# Patient Record
Sex: Female | Born: 1965
Health system: Southern US, Community
[De-identification: ages and names within clinical notes are randomized; demographics above are authoritative.]

## PROBLEM LIST (undated history)

## (undated) ENCOUNTER — Inpatient Hospital Stay (HOSPITAL_COMMUNITY): Payer: Self-pay

## (undated) DIAGNOSIS — F329 Major depressive disorder, single episode, unspecified: Secondary | ICD-10-CM

## (undated) DIAGNOSIS — Z8619 Personal history of other infectious and parasitic diseases: Secondary | ICD-10-CM

## (undated) DIAGNOSIS — O139 Gestational [pregnancy-induced] hypertension without significant proteinuria, unspecified trimester: Secondary | ICD-10-CM

## (undated) DIAGNOSIS — R87619 Unspecified abnormal cytological findings in specimens from cervix uteri: Secondary | ICD-10-CM

## (undated) DIAGNOSIS — B009 Herpesviral infection, unspecified: Secondary | ICD-10-CM

## (undated) DIAGNOSIS — O09529 Supervision of elderly multigravida, unspecified trimester: Secondary | ICD-10-CM

## (undated) DIAGNOSIS — R1110 Vomiting, unspecified: Secondary | ICD-10-CM

## (undated) DIAGNOSIS — IMO0002 Reserved for concepts with insufficient information to code with codable children: Secondary | ICD-10-CM

## (undated) DIAGNOSIS — A630 Anogenital (venereal) warts: Secondary | ICD-10-CM

## (undated) DIAGNOSIS — F419 Anxiety disorder, unspecified: Secondary | ICD-10-CM

## (undated) DIAGNOSIS — F32A Depression, unspecified: Secondary | ICD-10-CM

## (undated) DIAGNOSIS — G43909 Migraine, unspecified, not intractable, without status migrainosus: Secondary | ICD-10-CM

## (undated) HISTORY — PX: GYNECOLOGIC CRYOSURGERY: SHX857

## (undated) HISTORY — DX: Personal history of other infectious and parasitic diseases: Z86.19

## (undated) HISTORY — DX: Anogenital (venereal) warts: A63.0

## (undated) HISTORY — DX: Supervision of elderly multigravida, unspecified trimester: O09.529

## (undated) HISTORY — PX: COLPOSCOPY: SHX161

## (undated) HISTORY — DX: Vomiting, unspecified: R11.10

## (undated) HISTORY — PX: BREAST SURGERY: SHX581

## (undated) HISTORY — DX: Herpesviral infection, unspecified: B00.9

---

## 1999-06-21 HISTORY — PX: BREAST ENHANCEMENT SURGERY: SHX7

## 2001-06-20 HISTORY — PX: WISDOM TOOTH EXTRACTION: SHX21

## 2004-05-27 ENCOUNTER — Inpatient Hospital Stay (HOSPITAL_COMMUNITY): Admission: AD | Admit: 2004-05-27 | Discharge: 2004-05-27 | Payer: Self-pay | Admitting: Obstetrics and Gynecology

## 2004-06-01 ENCOUNTER — Inpatient Hospital Stay (HOSPITAL_COMMUNITY): Admission: AD | Admit: 2004-06-01 | Discharge: 2004-06-04 | Payer: Self-pay | Admitting: Obstetrics and Gynecology

## 2004-06-14 ENCOUNTER — Observation Stay (HOSPITAL_COMMUNITY): Admission: AD | Admit: 2004-06-14 | Discharge: 2004-06-15 | Payer: Self-pay | Admitting: Obstetrics and Gynecology

## 2004-06-23 ENCOUNTER — Observation Stay (HOSPITAL_COMMUNITY): Admission: AD | Admit: 2004-06-23 | Discharge: 2004-06-23 | Payer: Self-pay | Admitting: Obstetrics and Gynecology

## 2004-09-15 ENCOUNTER — Inpatient Hospital Stay (HOSPITAL_COMMUNITY): Admission: AD | Admit: 2004-09-15 | Discharge: 2004-09-15 | Payer: Self-pay | Admitting: Obstetrics and Gynecology

## 2004-11-09 ENCOUNTER — Observation Stay (HOSPITAL_COMMUNITY): Admission: AD | Admit: 2004-11-09 | Discharge: 2004-11-10 | Payer: Self-pay | Admitting: Obstetrics and Gynecology

## 2004-12-08 ENCOUNTER — Inpatient Hospital Stay (HOSPITAL_COMMUNITY): Admission: AD | Admit: 2004-12-08 | Discharge: 2004-12-10 | Payer: Self-pay | Admitting: Obstetrics and Gynecology

## 2005-03-02 ENCOUNTER — Other Ambulatory Visit: Admission: RE | Admit: 2005-03-02 | Discharge: 2005-03-02 | Payer: Self-pay | Admitting: Obstetrics and Gynecology

## 2005-09-23 ENCOUNTER — Emergency Department (HOSPITAL_COMMUNITY): Admission: EM | Admit: 2005-09-23 | Discharge: 2005-09-23 | Payer: Self-pay | Admitting: Emergency Medicine

## 2008-01-27 ENCOUNTER — Emergency Department (HOSPITAL_BASED_OUTPATIENT_CLINIC_OR_DEPARTMENT_OTHER): Admission: EM | Admit: 2008-01-27 | Discharge: 2008-01-27 | Payer: Self-pay | Admitting: Emergency Medicine

## 2009-03-27 ENCOUNTER — Ambulatory Visit: Payer: Self-pay | Admitting: Interventional Radiology

## 2009-03-27 ENCOUNTER — Emergency Department (HOSPITAL_BASED_OUTPATIENT_CLINIC_OR_DEPARTMENT_OTHER): Admission: EM | Admit: 2009-03-27 | Discharge: 2009-03-27 | Payer: Self-pay | Admitting: Emergency Medicine

## 2010-06-20 HISTORY — PX: CERVICAL BIOPSY: SHX590

## 2010-07-19 ENCOUNTER — Emergency Department (HOSPITAL_BASED_OUTPATIENT_CLINIC_OR_DEPARTMENT_OTHER)
Admission: EM | Admit: 2010-07-19 | Discharge: 2010-07-19 | Payer: Self-pay | Source: Home / Self Care | Admitting: Emergency Medicine

## 2010-11-05 NOTE — Discharge Summary (Signed)
NAME:  Linda Fitzpatrick, Linda Fitzpatrick      ACCOUNT NO.:  [id number]   MEDICAL RECORD NO.:  [id number]          PATIENT TYPE:  INP   LOCATION:  9131                          FACILITY:  [facility]   PHYSICIAN:  Dineen Kid. Rana Snare, M.D.    DATE OF BIRTH:  1966-03-15   DATE OF ADMISSION:  06/01/2004  DATE OF DISCHARGE:  06/04/2004                                 DISCHARGE SUMMARY   ADMITTING DIAGNOSES:  1.  Intrauterine pregnancy at 10 weeks estimated gestational age.  2.  Hyperemesis gravidarum.   </DISCHARGE DIAGNOSIS  Intrauterine pregnancy at 10+ weeks estimated gestational age with  hyperemesis gravidarum, resolved.   REASON FOR ADMISSION:  Please see dictated H&P.   HOSPITAL COURSE:  The patient was a 45 year old gravida 2, para 0 that was  admitted to [facility] with severe hyperemesis which had  been unresponsive to outpatient management. The patient had been previously  hospitalized and had been discharged home on oral Medrol protocol, but the  patient was unable to keep down any food or fluid and continues to  experience severe nausea and vomiting. Recent thyroid panel had been within  normal limits. The patient was readmitted at this time for hydration and  further management. On admission, vital signs were stable, she was afebrile.  Uterus was nontender. The patient was given IV fluids and continued on  Medrol dose pack and Zofran IV, Reglan, and Pepcid as needed. On the  following morning the patient was much improved. No emesis had occurred  since admission. Vital signs remained stable. She was afebrile. Abdomen  soft. The patient was changed to oral medications and plan was that if she  could tolerate p.m. medications she would be discharged on the following  morning. On the following morning the patient continued without vomiting on  p.o. medications. Vital signs were stable, she was afebrile, abdomen soft.  She was tolerating a regular diet. Discharge instructions were  given and she  was discharged home.   CONDITION ON DISCHARGE:  Stable.   DIET:  Regular as tolerated.   ACTIVITY:  Up as desired.   FOLLOW UP:  Patient is to follow up in the office in approximately 10 days.  She is to call for nausea, vomiting, vaginal bleeding or uterine cramping.   DISCHARGE MEDICATIONS:  1.  Reglan 10 mg p.o. prior to meals and at bedtime.  2.  Pepcid 20 mg one p.o. b.i.d.  3.  The patient to continue Medrol Dosepak.      CC/MEDQ  D:  09/13/2004  T:  09/13/2004  Job:  [id number]

## 2010-11-05 NOTE — H&P (Signed)
Linda Fitzpatrick, RIES      ACCOUNT NO.:  [id number]   MEDICAL RECORD NO.:  [id number]           PATIENT TYPE:   LOCATION:                                 FACILITY:   PHYSICIAN:  Duke Salvia. Marcelle Overlie, M.D.    DATE OF BIRTH:   DATE OF ADMISSION:  06/01/2004  DATE OF DISCHARGE:                                HISTORY & PHYSICAL   CHIEF COMPLAINT:  Hyperemesis.   HISTORY OF PRESENT ILLNESS:  This 45 year old G2, P0-0-1-0, EDD of July 1,  has had severe hyperemesis that has been unresponsive to outpatient  management.  She has been hospitalized previously at [facility] and was discharged  home on the oral Medrol protocol, but she cannot keep this down and  continues to experience severe nausea and vomiting.  Recent thyroid profile  was normal.  She is readmitted at this time for hydration and further  evaluation.   PAST MEDICAL HISTORY:   ALLERGIES:  None.   OBSTETRICAL HISTORY:  One TAB in 1993.  She also had cryo in 1993.   PAST SURGICAL HISTORY:  She has had breast augmentation.   FAMILY HISTORY:  Significant for mother with hypertension.  The patient, her  father, and sister have all had problems with migraine headaches.   PHYSICAL EXAMINATION:  VITAL SIGNS:  Temperature 98.2, blood pressure  100/60.  HEENT:  Unremarkable.  NECK:  Supple without masses.  LUNGS:  Clear.  CARDIOVASCULAR:  Regular rate and rhythm without murmurs, rubs, or gallops.  BREASTS:  Not examined.  ABDOMEN:  Soft, flat, nontender.  PELVIC:  Exam deferred pending ultrasound in the a.m.  EXTREMITIES/NEUROLOGIC:  Unremarkable.   IMPRESSION:  1.  A 10+ week intrauterine pregnancy.  2.  Severe hyperemesis.   PLAN:  Will admit for Zofran, Reglan, and Pepcid protocol IV, plus IV  hydration.    Rich  RMH/MEDQ  D:  06/01/2004  T:  06/01/2004  Job:  [id number]

## 2011-06-21 NOTE — L&D Delivery Note (Signed)
Delivery Note  SVD viable female Apgars 7,8 over 2nd degree ML lac.  Placenta delivered spontaneously intact with 3VC. Repair with 2-0 Chromic with good support and hemostasis noted and R/V exam confirms.  PH art was snet.  [place] cord blood was done.  Mother and baby were doing well.  EBL 300cc  Candice Camp, MD

## 2011-06-24 ENCOUNTER — Inpatient Hospital Stay (HOSPITAL_COMMUNITY)
Admission: AD | Admit: 2011-06-24 | Discharge: 2011-06-24 | Disposition: A | Discharge status: Home / Self Care | Payer: Managed Care, Other (non HMO) | Source: Ambulatory Visit | Attending: Obstetrics and Gynecology | Admitting: Obstetrics and Gynecology

## 2011-06-24 ENCOUNTER — Encounter (HOSPITAL_COMMUNITY): Payer: Self-pay

## 2011-06-24 DIAGNOSIS — O210 Mild hyperemesis gravidarum: Secondary | ICD-10-CM | POA: Insufficient documentation

## 2011-06-24 DIAGNOSIS — O26899 Other specified pregnancy related conditions, unspecified trimester: Secondary | ICD-10-CM

## 2011-06-24 DIAGNOSIS — O219 Vomiting of pregnancy, unspecified: Secondary | ICD-10-CM

## 2011-06-24 DIAGNOSIS — R51 Headache: Secondary | ICD-10-CM

## 2011-06-24 HISTORY — DX: Reserved for concepts with insufficient information to code with codable children: IMO0002

## 2011-06-24 HISTORY — DX: Depression, unspecified: F32.A

## 2011-06-24 HISTORY — DX: Unspecified abnormal cytological findings in specimens from cervix uteri: R87.619

## 2011-06-24 HISTORY — DX: Anxiety disorder, unspecified: F41.9

## 2011-06-24 HISTORY — DX: Major depressive disorder, single episode, unspecified: F32.9

## 2011-06-24 HISTORY — DX: Gestational (pregnancy-induced) hypertension without significant proteinuria, unspecified trimester: O13.9

## 2011-06-24 LAB — URINE MICROSCOPIC-ADD ON

## 2011-06-24 LAB — URINALYSIS, ROUTINE W REFLEX MICROSCOPIC
Bilirubin Urine: NEGATIVE
Hgb urine dipstick: NEGATIVE
Ketones, ur: NEGATIVE mg/dL
Nitrite: NEGATIVE
Protein, ur: NEGATIVE mg/dL
Specific Gravity, Urine: 1.01 (ref 1.005–1.030)
Urobilinogen, UA: 0.2 mg/dL (ref 0.0–1.0)

## 2011-06-24 LAB — OB RESULTS CONSOLE HEPATITIS B SURFACE ANTIGEN: Hepatitis B Surface Ag: NEGATIVE

## 2011-06-24 LAB — OB RESULTS CONSOLE GC/CHLAMYDIA: Chlamydia: NEGATIVE

## 2011-06-24 LAB — OB RESULTS CONSOLE RPR: RPR: NONREACTIVE

## 2011-06-24 LAB — OB RESULTS CONSOLE ANTIBODY SCREEN: Antibody Screen: NEGATIVE

## 2011-06-24 LAB — OB RESULTS CONSOLE RUBELLA ANTIBODY, IGM: Rubella: IMMUNE

## 2011-06-24 MED ORDER — HYDROCODONE-ACETAMINOPHEN 5-325 MG PO TABS: 1.0000 | ORAL_TABLET | Freq: Three times a day (TID) | ORAL | Status: AC | PRN

## 2011-06-24 MED ORDER — PROMETHAZINE HCL 25 MG/ML IJ SOLN
12.5000 mg | Freq: Once | INTRAVENOUS | Status: AC
  Administered 2011-06-24: 12.5 mg via INTRAVENOUS
  Filled 2011-06-24: qty 0.5

## 2011-06-24 MED ORDER — BUTORPHANOL TARTRATE 2 MG/ML IJ SOLN
1.0000 mg | Freq: Once | INTRAMUSCULAR | Status: AC
  Administered 2011-06-24: 1 mg via INTRAVENOUS
  Filled 2011-06-24: qty 1

## 2011-06-24 MED ORDER — LACTATED RINGERS IV SOLN
INTRAVENOUS | Status: DC
  Administered 2011-06-24: 16:00:00 via INTRAVENOUS

## 2011-06-24 NOTE — Progress Notes (Signed)
Vomiting was taking Zofran ODT not helping

## 2011-06-24 NOTE — ED Provider Notes (Signed)
History     Chief Complaint  Patient presents with  . Hyperemesis Gravidarum   HPI Linda Fitzpatrick is 46 y.o. [phone] [redacted]w[redacted]d weeks presenting with hyperemesis.  She is a patient of Dr. Vance Gather.  She was in the office today for initial prenatal workup and reported sxs to the Rn.  Sent here for hydration.  Had same with previous pregnancy for the entire pregnancy.  Prefers not to use Zofran because it gives her a headache.      Past Medical History  Diagnosis Date  . Anxiety   . Depression   . Abnormal Pap smear   . Pregnancy induced hypertension     Past Surgical History  Procedure Date  . Cervical biopsy 2012    abnormal pap  . Breast enhancement surgery 2001  . Wisdom tooth extraction 2003    Family History  Problem Relation Age of Onset  . Anesthesia problems Neg Hx     History  Substance Use Topics  . Smoking status: Never Smoker   . Smokeless tobacco: Never Used  . Alcohol Use: No    Allergies:  Allergies  Allergen Reactions  . Tramadol Nausea Only and Other (See Comments)    dizzieness and leg cramps    Prescriptions prior to admission  Medication Sig Dispense Refill  . buPROPion (WELLBUTRIN XL) 300 MG 24 hr tablet Take 300 mg by mouth daily.        . calcium carbonate (TUMS - DOSED IN MG ELEMENTAL CALCIUM) 500 MG chewable tablet Chew 1 tablet by mouth 3 (three) times daily as needed. For stomach upset.       Marland Kitchen FLUoxetine (PROZAC) 20 MG capsule Take 20 mg by mouth daily.        . ondansetron (ZOFRAN-ODT) 8 MG disintegrating tablet Take 8 mg by mouth every 6 (six) hours as needed. For nausea       . Pediatric Multivit-Minerals-C (FLINTSTONES GUMMIES PO) Take 2 tablets by mouth daily.        . promethazine (PHENERGAN) 25 MG suppository Place 25 mg rectally daily as needed. For nausea         Review of Systems  Gastrointestinal: Positive for nausea and vomiting.  Neurological: Positive for headaches.   Physical Exam   Blood pressure 117/63, pulse 84,  temperature 98.3 F (36.8 C), temperature source Oral, resp. rate 16, height 5\' 2"  (1.575 m), weight 118 lb (53.524 kg), last menstrual period 04/17/2011.  Physical Exam  Constitutional: She is oriented to person, place, and time. She appears well-developed and well-nourished.  HENT:  Head: Normocephalic.  Neck: Normal range of motion.  Neurological: She is alert and oriented to person, place, and time.  Skin: Skin is warm and dry.  Psychiatric: She has a normal mood and affect.    MAU Course  Procedures  Stadol 1mg  IV ordered.  Patient has contraindication by pharmacy alert for nausea with tramadol.  IV hydration completed  MDM Called Dr. Rana Snare to discuss patient. Will hydrate with 1 liter of fluid with Phenergan.   15:40 reported to Dr. Rana Snare that her nausea and vomiting has improved.  Headache continues and is asking for medication.  Order given for Stadol 1mg  IV for pain now and give Rx for Vicoden for home 17:35.  Patient is feeling better now.  Her nausea and vomiting is still controlled.  She has been able to eat crackers.  Her headache is also better after given Stadol 1mg .  She is ready for discharge.  She has a ride.  Assessment and Plan  A: Nausea and vomiting in early pregnancy     Headache  P:  Per Dr. Rana Snare, she has Rxs for phenergan and zofran at home.        Rx for Vicodin \      Instructed to follow up with Dr. Jacky Kindle M 06/24/2011, 12:58 PM   Matt Holmes, NP 06/24/11 1743

## 2011-09-21 ENCOUNTER — Inpatient Hospital Stay (HOSPITAL_COMMUNITY)
Admission: AD | Admit: 2011-09-21 | Discharge: 2011-09-21 | Disposition: A | Discharge status: Home / Self Care | Payer: Managed Care, Other (non HMO) | Source: Ambulatory Visit | Attending: Obstetrics and Gynecology | Admitting: Obstetrics and Gynecology

## 2011-09-21 ENCOUNTER — Encounter (HOSPITAL_COMMUNITY): Payer: Self-pay | Admitting: *Deleted

## 2011-09-21 DIAGNOSIS — O210 Mild hyperemesis gravidarum: Secondary | ICD-10-CM | POA: Insufficient documentation

## 2011-09-21 LAB — URINALYSIS, ROUTINE W REFLEX MICROSCOPIC
Bilirubin Urine: NEGATIVE
Glucose, UA: NEGATIVE mg/dL
Nitrite: NEGATIVE
Protein, ur: NEGATIVE mg/dL
Specific Gravity, Urine: >1.03 — ABNORMAL HIGH (ref 1.005–1.030)
Urobilinogen, UA: 0.2 mg/dL (ref 0.0–1.0)

## 2011-09-21 LAB — URINE MICROSCOPIC-ADD ON

## 2011-09-21 MED ORDER — ONDANSETRON HCL 4 MG PO TABS: 8.0000 mg | ORAL_TABLET | Freq: Once | ORAL | Status: DC

## 2011-09-21 MED ORDER — DEXTROSE 5 % IN LACTATED RINGERS IV BOLUS
500.0000 mL | Freq: Once | INTRAVENOUS | Status: AC
Start: 2011-09-21 — End: 2011-09-21
  Administered 2011-09-21: 500 mL via INTRAVENOUS

## 2011-09-21 NOTE — MAU Provider Note (Signed)
History     CSN: [id number]  Arrival date and time: 09/21/11 1744   First Provider Initiated Contact with Patient 09/21/11 1830      Chief Complaint  Patient presents with  . Hematemesis   HPI Linda Fitzpatrick is 46 y.o. [phone] [redacted]w[redacted]d weeks presenting with continued nausea, vomiting.  "Nothing is working".  She was getting IV hydration 1 liter a day at home until last week, was discontinued because she wasn't spilling ketones.  " I am at my breaking point with the leg cramps, insomnia, I am in bed 24 hrs a day.  I have to stay flat and quiet".  Saw Dr. Marcelle Overlie yesterday, told to call if sxs worsened.  Called the office.  No one treatment completely worked--pumped worked some days and not others.  Got knots under the skin with zofran and reglan injections caused knots.  Same symptoms with first pregnancy.      Past Medical History  Diagnosis Date  . Anxiety   . Depression   . Abnormal Pap smear   . Pregnancy induced hypertension     Past Surgical History  Procedure Date  . Cervical biopsy 2012    abnormal pap  . Breast enhancement surgery 2001  . Wisdom tooth extraction 2003    Family History  Problem Relation Age of Onset  . Anesthesia problems Neg Hx     History  Substance Use Topics  . Smoking status: Never Smoker   . Smokeless tobacco: Never Used  . Alcohol Use: No    Allergies:  Allergies  Allergen Reactions  . Tramadol Nausea Only and Other (See Comments)    dizzieness and leg cramps    Prescriptions prior to admission  Medication Sig Dispense Refill  . acetaminophen (TYLENOL) 325 MG tablet Take 650 mg by mouth every 6 (six) hours as needed. Head ache      . buPROPion (WELLBUTRIN XL) 300 MG 24 hr tablet Take 300 mg by mouth daily.        . calcium carbonate (TUMS - DOSED IN MG ELEMENTAL CALCIUM) 500 MG chewable tablet Chew 1 tablet by mouth 3 (three) times daily as needed. For stomach upset.       Marland Kitchen FLUoxetine (PROZAC) 20 MG capsule Take 20 mg by mouth  daily.        Marland Kitchen nystatin cream (MYCOSTATIN) Apply topically 2 (two) times daily.      . ondansetron (ZOFRAN-ODT) 8 MG disintegrating tablet Take 8 mg by mouth every 6 (six) hours as needed. For nausea       . Pediatric Multivit-Minerals-C (FLINTSTONES GUMMIES PO) Take 2 tablets by mouth daily.        . promethazine (PHENERGAN) 25 MG suppository Place 25 mg rectally daily as needed. For nausea       . terconazole (TERAZOL 7) 0.4 % vaginal cream Place 1 applicator vaginally at bedtime.        Review of Systems  Gastrointestinal: Positive for nausea.  Musculoskeletal:       + leg cramps   Physical Exam   Blood pressure 131/76, pulse 85, temperature 98 F (36.7 C), temperature source Oral, resp. rate 16, height 5' 3.5" (1.613 m), weight 59.966 kg (132 lb 3.2 oz), last menstrual period 04/17/2011, SpO2 100.00%.  Physical Exam  Constitutional: She is oriented to person, place, and time. She appears well-nourished. No distress.  HENT:  Head: Normocephalic.  Neck: Normal range of motion.  Respiratory: Effort normal.  Neurological: She is alert and  oriented to person, place, and time.  Skin: Skin is warm and dry.  Psychiatric: She has a normal mood and affect. Her behavior is normal.      Results for orders placed during the hospital encounter of 09/21/11 (from the past 24 hour(s))  URINALYSIS, ROUTINE W REFLEX MICROSCOPIC     Status: Abnormal   Collection Time   09/21/11  6:10 PM      Component Value Range   Color, Urine YELLOW  YELLOW    APPearance CLOUDY (*) CLEAR    Specific Gravity, Urine >1.030 (*) 1.005 - 1.030    pH 6.0  5.0 - 8.0    Glucose, UA NEGATIVE  NEGATIVE (mg/dL)   Hgb urine dipstick TRACE (*) NEGATIVE    Bilirubin Urine NEGATIVE  NEGATIVE    Ketones, ur NEGATIVE  NEGATIVE (mg/dL)   Protein, ur NEGATIVE  NEGATIVE (mg/dL)   Urobilinogen, UA 0.2  0.0 - 1.0 (mg/dL)   Nitrite NEGATIVE  NEGATIVE    Leukocytes, UA MODERATE (*) NEGATIVE   URINE MICROSCOPIC-ADD ON      Status: Abnormal   Collection Time   09/21/11  6:10 PM      Component Value Range   Squamous Epithelial / LPF MANY (*) RARE    WBC, UA 7-10  <3 (WBC/hpf)   Bacteria, UA MANY (*) RARE    Crystals CA OXALATE CRYSTALS (*) NEGATIVE    Urine-Other FEW YEAST      MAU Course  Procedures  MDM19:00 Reported patient sxs to Dr. Arelia Sneddon, he is aware of patient's hx.  Order given for 1 liter of D5LR, if not driving may add Phenergan 25mg .  Patient informed and she is trying to get a ride home.  She prefers to have the Phenergan Patient rang out and stated she does not have a ride home.  Will give her Zofran 8mg  ODT--patient declined Zofran. 20:28  Patient is ready to leave when liter of fluid is out.  She does not want Rx for Zofran.  Assessment and Plan  A: Hyperemesis--persistent into 2nd trimester  P:  Dr. Arelia Sneddon gave instructions for her to call the office and to come in to discuss treatment plan with Dr. Rana Snare on Monday.      Patient wants to call tomorrow.     Has medication at home.  Joshua Zeringue,EVE M 09/21/2011, 6:34 PM

## 2011-09-21 NOTE — MAU Note (Signed)
Patient states she has hyperemesis. Has had a reglan and zofran pump that does not work. Took out her reglan pump last night. States she is unable to function or sleep due to the nausea and vomiting. Nausea is worse than the vomiting. Has been getting home health IV hydration. No pain at this time.

## 2011-09-21 NOTE — MAU Note (Signed)
Patient states she does not want the Zofran since it does not help but would like the IV fluids.

## 2011-09-21 NOTE — Discharge Instructions (Signed)
Hyperemesis Gravidarum Diet  Hyperemesis gravidarum is a severe form of morning sickness. It is characterized by frequent and severe vomiting. It happens during the first trimester of pregnancy. It may be caused by the rapid hormone changes that happen during pregnancy. It is associated with a 5% weight loss of pre-pregnancy weight. The hyperemesis diet may be used to lessen symptoms of nausea and vomiting.  EATING GUIDELINES  · Eat 5 to 6 small meals daily instead of 3 large meals.  · Avoid foods with strong smells.  · Avoid drinking 30 minutes before and after meals.  · Avoid fried or high-fat foods, such as butter and cream sauces.  · Starchy foods are usually well-tolerated, such as cereal, toast, bread, potatoes, pasta, rice, and pretzels.  · Eat crackers before you get out of bed in the morning.  · Avoid spicy foods.  · Ginger may help with nausea. Add ¼ tsp ginger to hot tea or choose ginger tea.  · Continue to take your prenatal vitamins as directed by your caregiver.  SAMPLE MEAL PLAN  Breakfast   · ½ cup oatmeal  · 1 slice toast  · 1 tsp heart-healthy margarine  · 1 tsp jelly  · 1 scrambled egg  Midmorning Snack   · 1 cup low-fat yogurt  Lunch   · Plain ham sandwich  · Carrot or celery sticks  · 1 small apple  · 3 graham crackers  Midafternoon Snack   · Cheese and crackers  Dinner  · 4 oz pork tenderloin  · 1 small baked potato  · 1 tsp margarine  · ½ cup broccoli  · ½ cup grapes  Evening Snack  · 1 cup pudding  Document Released: 04/03/2007 Document Revised: 05/26/2011 Document Reviewed: 10/01/2010  ExitCare® Patient Information ©2012 [employer].  Hyperemesis Gravidarum  Hyperemesis gravidarum is a severe form of nausea and vomiting that happens during pregnancy. Hyperemesis is worse than morning sickness. It may cause a woman to have nausea or vomiting all day for many days. It may keep a woman from eating and drinking enough food and liquids. Hyperemesis usually occurs during the first half (the  first 20 weeks) of pregnancy. It often goes away once a woman is in her second half of pregnancy. However, sometimes hyperemesis continues through an entire pregnancy.   CAUSES   The cause of this condition is not completely known but is thought to be due to changes in the body's hormones when pregnant. It could be the high level of the pregnancy hormone or an increase in estrogen in the body.   SYMPTOMS   · Severe nausea and vomiting.  · Nausea that does not go away.  · Vomiting that does not allow you to keep any food down.  · Weight loss and body fluid loss (dehydration).  · Having no desire to eat or not liking food you have previously enjoyed.  DIAGNOSIS   Your caregiver may ask you about your symptoms. Your caregiver may also order blood tests and urine tests to make sure something else is not causing the problem.   TREATMENT   You may only need medicine to control the problem. If medicines do not control the nausea and vomiting, you will be treated in the hospital to prevent dehydration, acidosis, weight loss, and changes in the electrolytes in your body that may harm the unborn baby (fetus). You may need intravenous (IV) fluids.   HOME CARE INSTRUCTIONS   · Take all medicine as directed   by your caregiver.  · Try eating a couple of dry crackers or toast in the morning before getting out of bed.  · Avoid foods and smells that upset your stomach.  · Avoid fatty and spicy foods. Eat 5 to 6 small meals a day.  · Do not drink when eating meals. Drink between meals.  · For snacks, eat high protein foods, such as cheese. Eat or suck on things that have ginger in them. Ginger helps nausea.  · Avoid food preparation. The smell of food can spoil your appetite.  · Avoid iron pills and iron in your multivitamins until after 3 to 4 months of being pregnant.  SEEK MEDICAL CARE IF:   · Your abdominal pain increases since the last time you saw your caregiver.  · You have a severe headache.  · You develop vision  problems.  · You feel you are losing weight.  SEEK IMMEDIATE MEDICAL CARE IF:   · You are unable to keep fluids down.  · You vomit blood.  · You have constant nausea and vomiting.  · You have a fever.  · You have excessive weakness, dizziness, fainting, or extreme thirst.  MAKE SURE YOU:   · Understand these instructions.  · Will watch your condition.  · Will get help right away if you are not doing well or get worse.  Document Released: 06/06/2005 Document Revised: 05/26/2011 Document Reviewed: 09/06/2010  ExitCare® Patient Information ©2012 [employer].

## 2011-09-21 NOTE — MAU Note (Addendum)
Patient very discouraged. Continues to be very nauseated. States, "I think I am at the breaking point - 22 weeks and no relief. Nothing is working." Pt. states she does not vomit very often and does not usually have ketones. States also that she has gained weight and this is why her pump was discontinued.

## 2011-10-20 ENCOUNTER — Inpatient Hospital Stay (HOSPITAL_COMMUNITY)
Admission: AD | Admit: 2011-10-20 | Discharge: 2011-10-20 | Disposition: A | Discharge status: Home / Self Care | Payer: Managed Care, Other (non HMO) | Source: Ambulatory Visit | Attending: Obstetrics and Gynecology | Admitting: Obstetrics and Gynecology

## 2011-10-20 DIAGNOSIS — O210 Mild hyperemesis gravidarum: Secondary | ICD-10-CM

## 2011-10-20 DIAGNOSIS — O212 Late vomiting of pregnancy: Secondary | ICD-10-CM | POA: Insufficient documentation

## 2011-10-20 LAB — URINALYSIS, ROUTINE W REFLEX MICROSCOPIC
Bilirubin Urine: NEGATIVE
Glucose, UA: NEGATIVE mg/dL
Ketones, ur: NEGATIVE mg/dL
Nitrite: NEGATIVE
Specific Gravity, Urine: 1.01 (ref 1.005–1.030)
Urobilinogen, UA: 0.2 mg/dL (ref 0.0–1.0)
pH: 6.5 (ref 5.0–8.0)

## 2011-10-20 LAB — URINE MICROSCOPIC-ADD ON

## 2011-10-20 MED ORDER — LACTATED RINGERS IV BOLUS (SEPSIS)
1000.0000 mL | Freq: Once | INTRAVENOUS | Status: AC
  Administered 2011-10-20: 1000 mL via INTRAVENOUS

## 2011-10-20 MED ORDER — LACTATED RINGERS IV SOLN
INTRAVENOUS | Status: DC
  Administered 2011-10-20: 20:00:00 via INTRAVENOUS

## 2011-10-20 NOTE — MAU Note (Signed)
Wynona Canes, CNM at bedside.  Assessment done and poc discussed with pt. Ok to dc efm per CNM.

## 2011-10-20 NOTE — MAU Provider Note (Signed)
Chief Complaint:  Hyperemesis Gravidarum   None     HPI  Linda Fitzpatrick is  46 y.o. [phone] at [redacted]w[redacted]d presents with hyperemesis.  She reports talking with Dr Vincente Poli yesterday who wanted her to come in to the MAU for IV fluids and possibly vitamins.  Pt reports forcing herself to drink some water before coming in to MAU and was able to keep some of them down.  She reports good fetal movement, denies LOF, vaginal bleeding, vaginal itching/burning, urinary symptoms, h/a, dizziness, n/v, or fever/chills.    Pregnancy Course: uncomplicated  Past Medical History: Past Medical History  Diagnosis Date  . Anxiety   . Depression   . Abnormal Pap smear   . Pregnancy induced hypertension     Past Surgical History: Past Surgical History  Procedure Date  . Cervical biopsy 2012    abnormal pap  . Breast enhancement surgery 2001  . Wisdom tooth extraction 2003    Family History: Family History  Problem Relation Age of Onset  . Anesthesia problems Neg Hx     Social History: History  Substance Use Topics  . Smoking status: Never Smoker   . Smokeless tobacco: Never Used  . Alcohol Use: No    Allergies:  Allergies  Allergen Reactions  . Tramadol Nausea Only and Other (See Comments)    dizzieness and leg cramps    Meds:  Prescriptions prior to admission  Medication Sig Dispense Refill  . acetaminophen (TYLENOL) 325 MG tablet Take 650 mg by mouth every 6 (six) hours as needed. Head ache      . nystatin cream (MYCOSTATIN) Apply topically 2 (two) times daily.      . Pediatric Multivit-Minerals-C (FLINTSTONES GUMMIES PO) Take 2 tablets by mouth daily.        . promethazine (PHENERGAN) 25 MG suppository Place 25 mg rectally daily as needed. For nausea       . terconazole (TERAZOL 7) 0.4 % vaginal cream Place 1 applicator vaginally at bedtime.          Physical Exam  Blood pressure 104/60, pulse 87, temperature 98.6 F (37 C), temperature source Oral, resp. rate 20, height 5\' 2"   (1.575 m), weight 60.782 kg (134 lb), last menstrual period 04/17/2011. GENERAL: Well-developed, well-nourished female in no acute distress.  HEENT: normocephalic, good dentition HEART: normal rate RESP: normal effort ABDOMEN: Soft, nontender, nondistended, gravid.  EXTREMITIES: Nontender, no edema NEURO: alert and oriented  SPECULUM EXAM: Deferred    FHT:  Baseline 135 , moderate variability, accelerations present, no decelerations Contractions: None   Labs: Results for orders placed during the hospital encounter of 10/20/11 (from the past 24 hour(s))  URINALYSIS, ROUTINE W REFLEX MICROSCOPIC     Status: Abnormal   Collection Time   10/20/11  5:35 PM      Component Value Range   Color, Urine YELLOW  YELLOW    APPearance CLOUDY (*) CLEAR    Specific Gravity, Urine 1.010  1.005 - 1.030    pH 6.5  5.0 - 8.0    Glucose, UA NEGATIVE  NEGATIVE (mg/dL)   Hgb urine dipstick TRACE (*) NEGATIVE    Bilirubin Urine NEGATIVE  NEGATIVE    Ketones, ur NEGATIVE  NEGATIVE (mg/dL)   Protein, ur NEGATIVE  NEGATIVE (mg/dL)   Urobilinogen, UA 0.2  0.0 - 1.0 (mg/dL)   Nitrite NEGATIVE  NEGATIVE    Leukocytes, UA LARGE (*) NEGATIVE   URINE MICROSCOPIC-ADD ON     Status: Abnormal  Collection Time   10/20/11  5:35 PM      Component Value Range   Squamous Epithelial / LPF FEW (*) RARE    WBC, UA 21-50  <3 (WBC/hpf)   Bacteria, UA FEW (*) RARE      Imaging:  Not indicated  Assessment: Hyperemesis gravidarium   Plan: Discussed pt with Dr Marcelle Overlie IV LR bolus, then LR 300/hour second bag Pt eating in MAU without difficulty  Urine for culture D/C home F/U with Dr Vincente Poli Return to MAU as needed  [name], [name] 5/2/20136:44 PM

## 2011-10-20 NOTE — MAU Note (Signed)
No longer getting fluids at home, doesn't have med pump any more, so has to come in.

## 2011-10-20 NOTE — Discharge Instructions (Signed)
Hyperemesis Gravidarum  Hyperemesis gravidarum is a severe form of nausea and vomiting that happens during pregnancy. Hyperemesis is worse than morning sickness. It may cause a woman to have nausea or vomiting all day for many days. It may keep a woman from eating and drinking enough food and liquids. Hyperemesis usually occurs during the first half (the first 20 weeks) of pregnancy. It often goes away once a woman is in her second half of pregnancy. However, sometimes hyperemesis continues through an entire pregnancy.   CAUSES   The cause of this condition is not completely known but is thought to be due to changes in the body's hormones when pregnant. It could be the high level of the pregnancy hormone or an increase in estrogen in the body.   SYMPTOMS    Severe nausea and vomiting.   Nausea that does not go away.   Vomiting that does not allow you to keep any food down.   Weight loss and body fluid loss (dehydration).   Having no desire to eat or not liking food you have previously enjoyed.  DIAGNOSIS   Your caregiver may ask you about your symptoms. Your caregiver may also order blood tests and urine tests to make sure something else is not causing the problem.   TREATMENT   You may only need medicine to control the problem. If medicines do not control the nausea and vomiting, you will be treated in the hospital to prevent dehydration, acidosis, weight loss, and changes in the electrolytes in your body that may harm the unborn baby (fetus). You may need intravenous (IV) fluids.   HOME CARE INSTRUCTIONS    Take all medicine as directed by your caregiver.   Try eating a couple of dry crackers or toast in the morning before getting out of bed.   Avoid foods and smells that upset your stomach.   Avoid fatty and spicy foods. Eat 5 to 6 small meals a day.   Do not drink when eating meals. Drink between meals.   For snacks, eat high protein foods, such as cheese. Eat or suck on things that have ginger in  them. Ginger helps nausea.   Avoid food preparation. The smell of food can spoil your appetite.   Avoid iron pills and iron in your multivitamins until after 3 to 4 months of being pregnant.  SEEK MEDICAL CARE IF:    Your abdominal pain increases since the last time you saw your caregiver.   You have a severe headache.   You develop vision problems.   You feel you are losing weight.  SEEK IMMEDIATE MEDICAL CARE IF:    You are unable to keep fluids down.   You vomit blood.   You have constant nausea and vomiting.   You have a fever.   You have excessive weakness, dizziness, fainting, or extreme thirst.  MAKE SURE YOU:    Understand these instructions.   Will watch your condition.   Will get help right away if you are not doing well or get worse.  Document Released: 06/06/2005 Document Revised: 05/26/2011 Document Reviewed: 09/06/2010  [employer] Patient Information 2012 [employer].

## 2011-10-22 LAB — URINE CULTURE
Colony Count: 95000
Culture  Setup Time: 201305030128

## 2012-01-17 ENCOUNTER — Telehealth (HOSPITAL_COMMUNITY): Payer: Self-pay | Admitting: *Deleted

## 2012-01-17 ENCOUNTER — Encounter (HOSPITAL_COMMUNITY): Payer: Self-pay | Admitting: *Deleted

## 2012-01-17 NOTE — Telephone Encounter (Signed)
Preadmission screen  

## 2012-01-18 ENCOUNTER — Encounter (HOSPITAL_COMMUNITY): Payer: Self-pay | Admitting: Anesthesiology

## 2012-01-18 ENCOUNTER — Encounter (HOSPITAL_COMMUNITY): Payer: Self-pay

## 2012-01-18 ENCOUNTER — Inpatient Hospital Stay (HOSPITAL_COMMUNITY): Payer: Managed Care, Other (non HMO) | Admitting: Anesthesiology

## 2012-01-18 ENCOUNTER — Inpatient Hospital Stay (HOSPITAL_COMMUNITY)
Admission: RE | Admit: 2012-01-18 | Discharge: 2012-01-20 | DRG: 775 | Disposition: A | Discharge status: Home / Self Care | Payer: Managed Care, Other (non HMO) | Source: Ambulatory Visit | Attending: Obstetrics and Gynecology | Admitting: Obstetrics and Gynecology

## 2012-01-18 DIAGNOSIS — O09529 Supervision of elderly multigravida, unspecified trimester: Secondary | ICD-10-CM | POA: Diagnosis present

## 2012-01-18 DIAGNOSIS — O99892 Other specified diseases and conditions complicating childbirth: Secondary | ICD-10-CM | POA: Diagnosis present

## 2012-01-18 DIAGNOSIS — Z22330 Carrier of Group B streptococcus: Secondary | ICD-10-CM

## 2012-01-18 DIAGNOSIS — O212 Late vomiting of pregnancy: Secondary | ICD-10-CM | POA: Diagnosis present

## 2012-01-18 LAB — CBC
HCT: 31.2 % — ABNORMAL LOW (ref 36.0–46.0)
MCH: 29.5 pg (ref 26.0–34.0)
MCHC: 33.3 g/dL (ref 30.0–36.0)
MCV: 88.4 fL (ref 78.0–100.0)
RDW: 13.8 % (ref 11.5–15.5)

## 2012-01-18 MED ORDER — FENTANYL 2.5 MCG/ML BUPIVACAINE 1/10 % EPIDURAL INFUSION (WH - ANES)
14.0000 mL/h | INTRAMUSCULAR | Status: DC
  Administered 2012-01-18 (×2): 14 mL/h via EPIDURAL
  Filled 2012-01-18 (×3): qty 60

## 2012-01-18 MED ORDER — TERBUTALINE SULFATE 1 MG/ML IJ SOLN: 0.2500 mg | Freq: Once | INTRAMUSCULAR | Status: DC | PRN

## 2012-01-18 MED ORDER — EPHEDRINE 5 MG/ML INJ: 10.0000 mg | INTRAVENOUS | Status: DC | PRN

## 2012-01-18 MED ORDER — ACETAMINOPHEN 325 MG PO TABS
650.0000 mg | ORAL_TABLET | ORAL | Status: DC | PRN
  Administered 2012-01-18 – 2012-01-19 (×2): 650 mg via ORAL
  Filled 2012-01-18 (×3): qty 2

## 2012-01-18 MED ORDER — SODIUM CHLORIDE 0.9 % IV SOLN
2.0000 g | Freq: Four times a day (QID) | INTRAVENOUS | Status: DC
  Administered 2012-01-18 (×2): 2 g via INTRAVENOUS
  Filled 2012-01-18 (×6): qty 2000

## 2012-01-18 MED ORDER — LIDOCAINE HCL (PF) 1 % IJ SOLN
INTRAMUSCULAR | Status: DC | PRN
  Administered 2012-01-18: 88 mL
  Administered 2012-01-18: 8 mL

## 2012-01-18 MED ORDER — PHENYLEPHRINE 40 MCG/ML (10ML) SYRINGE FOR IV PUSH (FOR BLOOD PRESSURE SUPPORT): 80.0000 ug | PREFILLED_SYRINGE | INTRAVENOUS | Status: DC | PRN

## 2012-01-18 MED ORDER — LACTATED RINGERS IV SOLN
INTRAVENOUS | Status: DC
  Administered 2012-01-18: 17:00:00 via INTRAVENOUS

## 2012-01-18 MED ORDER — OXYTOCIN 40 UNITS IN LACTATED RINGERS INFUSION - SIMPLE MED: 1.0000 m[IU]/min | INTRAVENOUS | Status: DC

## 2012-01-18 MED ORDER — CITRIC ACID-SODIUM CITRATE 334-500 MG/5ML PO SOLN: 30.0000 mL | ORAL | Status: DC | PRN

## 2012-01-18 MED ORDER — TERBUTALINE SULFATE 1 MG/ML IJ SOLN: 0.2500 mg | Freq: Once | INTRAMUSCULAR | Status: AC | PRN

## 2012-01-18 MED ORDER — EPHEDRINE 5 MG/ML INJ
10.0000 mg | INTRAVENOUS | Status: DC | PRN
  Filled 2012-01-18: qty 4

## 2012-01-18 MED ORDER — OXYTOCIN BOLUS FROM INFUSION
250.0000 mL | Freq: Once | INTRAVENOUS | Status: AC
  Administered 2012-01-18: 250 mL via INTRAVENOUS
  Filled 2012-01-18: qty 500

## 2012-01-18 MED ORDER — OXYCODONE-ACETAMINOPHEN 5-325 MG PO TABS: 1.0000 | ORAL_TABLET | ORAL | Status: DC | PRN

## 2012-01-18 MED ORDER — FLEET ENEMA 7-19 GM/118ML RE ENEM: 1.0000 | ENEMA | RECTAL | Status: DC | PRN

## 2012-01-18 MED ORDER — PHENYLEPHRINE 40 MCG/ML (10ML) SYRINGE FOR IV PUSH (FOR BLOOD PRESSURE SUPPORT)
80.0000 ug | PREFILLED_SYRINGE | INTRAVENOUS | Status: DC | PRN
  Filled 2012-01-18: qty 5

## 2012-01-18 MED ORDER — LACTATED RINGERS IV SOLN: 500.0000 mL | INTRAVENOUS | Status: DC | PRN

## 2012-01-18 MED ORDER — LIDOCAINE HCL (PF) 1 % IJ SOLN: 30.0000 mL | INTRAMUSCULAR | Status: DC | PRN

## 2012-01-18 MED ORDER — LACTATED RINGERS IV SOLN
500.0000 mL | Freq: Once | INTRAVENOUS | Status: AC
  Administered 2012-01-18: 500 mL via INTRAVENOUS

## 2012-01-18 MED ORDER — FENTANYL 2.5 MCG/ML BUPIVACAINE 1/10 % EPIDURAL INFUSION (WH - ANES)
INTRAMUSCULAR | Status: DC | PRN
  Administered 2012-01-18: 14 mL/h via EPIDURAL

## 2012-01-18 MED ORDER — DIPHENHYDRAMINE HCL 50 MG/ML IJ SOLN: 12.5000 mg | INTRAMUSCULAR | Status: DC | PRN

## 2012-01-18 MED ORDER — IBUPROFEN 600 MG PO TABS: 600.0000 mg | ORAL_TABLET | Freq: Four times a day (QID) | ORAL | Status: DC | PRN

## 2012-01-18 MED ORDER — OXYTOCIN 40 UNITS IN LACTATED RINGERS INFUSION - SIMPLE MED
62.5000 mL/h | Freq: Once | INTRAVENOUS | Status: AC
  Administered 2012-01-18: 999 mL/h via INTRAVENOUS
  Filled 2012-01-18: qty 1000

## 2012-01-18 MED ORDER — ONDANSETRON HCL 4 MG/2ML IJ SOLN: 4.0000 mg | Freq: Four times a day (QID) | INTRAMUSCULAR | Status: DC | PRN

## 2012-01-18 NOTE — Anesthesia Preprocedure Evaluation (Signed)
Anesthesia Evaluation  Patient identified by MRN, date of birth, ID band Patient awake    Reviewed: Allergy & Precautions, H&P , NPO status , Patient's Chart, lab work & pertinent test results  Airway Mallampati: II TM Distance: >3 FB Neck ROM: full    Dental No notable dental hx.    Pulmonary neg pulmonary ROS,  breath sounds clear to auscultation  Pulmonary exam normal       Cardiovascular     Neuro/Psych Anxiety Depression negative neurological ROS     GI/Hepatic negative GI ROS, Neg liver ROS,   Endo/Other  negative endocrine ROS  Renal/GU negative Renal ROS  negative genitourinary   Musculoskeletal negative musculoskeletal ROS (+)   Abdominal Normal abdominal exam  (+)   Peds negative pediatric ROS (+) Delivery details - Hematology negative hematology ROS (+)   Anesthesia Other Findings   Reproductive/Obstetrics (+) Pregnancy                           Anesthesia Physical Anesthesia Plan  ASA: II  Anesthesia Plan: Epidural   Post-op Pain Management:    Induction:   Airway Management Planned:   Additional Equipment:   Intra-op Plan:   Post-operative Plan:   Informed Consent: I have reviewed the patients History and Physical, chart, labs and discussed the procedure including the risks, benefits and alternatives for the proposed anesthesia with the patient or authorized representative who has indicated his/her understanding and acceptance.     Plan Discussed with:   Anesthesia Plan Comments:         Anesthesia Quick Evaluation

## 2012-01-18 NOTE — H&P (Signed)
Linda Fitzpatrick is a 46 y.o. female presenting for induction of labor due to severe hyperemesis throughout pregnancy and desires induction GBS + History OB History    Grav Para Term Preterm Abortions TAB SAB Ect Mult Living   3 1 1  0 1 1 0 0 0 1     Past Medical History  Diagnosis Date  . Anxiety   . Depression   . Abnormal Pap smear   . Pregnancy induced hypertension   . H/O varicella   . HPV (human papilloma virus) anogenital infection   . AMA (advanced maternal age) multigravida 35+   . Hyperemesis   . HSV-1 infection   . HSV-2 infection    Past Surgical History  Procedure Date  . Cervical biopsy 2012    abnormal pap  . Breast enhancement surgery 2001  . Wisdom tooth extraction 2003  . Gynecologic cryosurgery   . Colposcopy   . Breast surgery     augmentation   Family History: family history includes Cancer in her father, maternal grandfather, and paternal grandmother; Hypertension in her mother; Migraines in her father and sister; and Thyroid disease in her mother.  There is no history of Anesthesia problems. Social History:  reports that she has never smoked. She has never used smokeless tobacco. She reports that she does not drink alcohol or use illicit drugs.   Prenatal Transfer Tool  Maternal Diabetes: No Genetic Screening: Normal Maternal Ultrasounds/Referrals: Normal Fetal Ultrasounds or other Referrals:  None Maternal Substance Abuse:  No Significant Maternal Medications:  None Significant Maternal Lab Results:  None Other Comments:  normal materni 21'  ROS  Dilation: 1.5 Effacement (%): 30 Station: -3 Exam by:: Dr. Rana Snare Blood pressure 122/88, pulse 90, temperature 98.4 F (36.9 C), temperature source Oral, resp. rate 20, height 5\' 2"  (1.575 m), weight 67.132 kg (148 lb), last menstrual period 04/17/2011. Exam Physical Exam  Prenatal labs: ABO, Rh: B/Positive/-- (01/04 0000) Antibody: Negative (01/04 0000) Rubella: Immune (01/04 0000) RPR:  Nonreactive (01/04 0000)  HBsAg: Negative (01/04 0000)  HIV: Non-reactive (01/04 0000)  GBS: Positive (07/03 0000)   Assessment/Plan: IUP at term  GBS+ IV antibiotics Pitocin then AROM Anticipate SVD   Linda Fitzpatrick C 01/18/2012, 8:44 AM

## 2012-01-18 NOTE — Progress Notes (Signed)
Pitocin started per MD at 0845 at 2 mu per md orders Pitocin increased at 0930 to 4mu per md orders Pitocin order not put in by MD,  MD notified,and pitocin order entered

## 2012-01-18 NOTE — Anesthesia Procedure Notes (Signed)
Epidural Patient location during procedure: OB Start time: 01/18/2012 3:03 PM End time: 01/18/2012 3:06 PM Reason for block: procedure for pain  Staffing Anesthesiologist: Sandrea Hughs Performed by: anesthesiologist   Preanesthetic Checklist Completed: patient identified, site marked, surgical consent, pre-op evaluation, timeout performed, IV checked, risks and benefits discussed and monitors and equipment checked  Epidural Patient position: sitting Prep: site prepped and draped and DuraPrep Patient monitoring: continuous pulse ox and blood pressure Approach: midline Injection technique: LOR air  Needle:  Needle type: Tuohy  Needle gauge: 17 G Needle length: 9 cm Needle insertion depth: 5 cm cm Catheter type: closed end flexible Catheter size: 19 Gauge Catheter at skin depth: 10 cm Test dose: negative and Other  Assessment Sensory level: T8 Events: blood not aspirated, injection not painful, no injection resistance, negative IV test and no paresthesia

## 2012-01-19 ENCOUNTER — Encounter (HOSPITAL_COMMUNITY): Payer: Self-pay

## 2012-01-19 LAB — CBC
HCT: 27.5 % — ABNORMAL LOW (ref 36.0–46.0)
MCV: 87.6 fL (ref 78.0–100.0)
RBC: 3.14 MIL/uL — ABNORMAL LOW (ref 3.87–5.11)
WBC: 19.7 10*3/uL — ABNORMAL HIGH (ref 4.0–10.5)

## 2012-01-19 MED ORDER — BENZOCAINE-MENTHOL 20-0.5 % EX AERO
1.0000 "application " | INHALATION_SPRAY | CUTANEOUS | Status: DC | PRN
  Administered 2012-01-19: 1 via TOPICAL
  Filled 2012-01-19: qty 56

## 2012-01-19 MED ORDER — DIBUCAINE 1 % RE OINT
1.0000 "application " | TOPICAL_OINTMENT | RECTAL | Status: DC | PRN
  Administered 2012-01-19: 1 via RECTAL
  Filled 2012-01-19: qty 28

## 2012-01-19 MED ORDER — ONDANSETRON HCL 4 MG/2ML IJ SOLN: 4.0000 mg | INTRAMUSCULAR | Status: DC | PRN

## 2012-01-19 MED ORDER — ONDANSETRON HCL 4 MG PO TABS: 4.0000 mg | ORAL_TABLET | ORAL | Status: DC | PRN

## 2012-01-19 MED ORDER — DIPHENHYDRAMINE HCL 25 MG PO CAPS: 25.0000 mg | ORAL_CAPSULE | Freq: Four times a day (QID) | ORAL | Status: DC | PRN

## 2012-01-19 MED ORDER — SIMETHICONE 80 MG PO CHEW: 80.0000 mg | CHEWABLE_TABLET | ORAL | Status: DC | PRN

## 2012-01-19 MED ORDER — WITCH HAZEL-GLYCERIN EX PADS
1.0000 "application " | MEDICATED_PAD | CUTANEOUS | Status: DC | PRN
  Administered 2012-01-19: 1 via TOPICAL

## 2012-01-19 MED ORDER — SENNOSIDES-DOCUSATE SODIUM 8.6-50 MG PO TABS
2.0000 | ORAL_TABLET | Freq: Every day | ORAL | Status: DC
  Administered 2012-01-19: 2 via ORAL

## 2012-01-19 MED ORDER — TETANUS-DIPHTH-ACELL PERTUSSIS 5-2.5-18.5 LF-MCG/0.5 IM SUSP: 0.5000 mL | Freq: Once | INTRAMUSCULAR | Status: DC

## 2012-01-19 MED ORDER — HYDROCORTISONE ACE-PRAMOXINE 1-1 % RE FOAM
1.0000 | Freq: Two times a day (BID) | RECTAL | Status: DC
  Administered 2012-01-19 – 2012-01-20 (×3): 1 via RECTAL
  Filled 2012-01-19 (×3): qty 10

## 2012-01-19 MED ORDER — MEDROXYPROGESTERONE ACETATE 150 MG/ML IM SUSP: 150.0000 mg | INTRAMUSCULAR | Status: DC | PRN

## 2012-01-19 MED ORDER — OXYCODONE-ACETAMINOPHEN 5-325 MG PO TABS
1.0000 | ORAL_TABLET | ORAL | Status: DC | PRN
  Administered 2012-01-20: 1 via ORAL
  Filled 2012-01-19: qty 1

## 2012-01-19 MED ORDER — IBUPROFEN 600 MG PO TABS
600.0000 mg | ORAL_TABLET | Freq: Four times a day (QID) | ORAL | Status: DC
  Administered 2012-01-19 – 2012-01-20 (×5): 600 mg via ORAL
  Filled 2012-01-19 (×6): qty 1

## 2012-01-19 MED ORDER — MEASLES, MUMPS & RUBELLA VAC ~~LOC~~ INJ
0.5000 mL | INJECTION | Freq: Once | SUBCUTANEOUS | Status: DC
  Filled 2012-01-19: qty 0.5

## 2012-01-19 MED ORDER — PRENATAL MULTIVITAMIN CH
1.0000 | ORAL_TABLET | Freq: Every day | ORAL | Status: DC
  Filled 2012-01-19: qty 1

## 2012-01-19 MED ORDER — LANOLIN HYDROUS EX OINT: TOPICAL_OINTMENT | CUTANEOUS | Status: DC | PRN

## 2012-01-19 MED ORDER — ZOLPIDEM TARTRATE 5 MG PO TABS: 5.0000 mg | ORAL_TABLET | Freq: Every evening | ORAL | Status: DC | PRN

## 2012-01-19 NOTE — Anesthesia Postprocedure Evaluation (Signed)
  Anesthesia Post-op Note  Patient: Linda Fitzpatrick  Procedure(s) Performed: * No procedures listed *  Patient Location: Mother/Baby  Anesthesia Type: Epidural  Level of Consciousness: awake  Airway and Oxygen Therapy: Patient Spontanous Breathing  Post-op Pain: mild  Post-op Assessment: Patient's Cardiovascular Status Stable and Respiratory Function Stable  Post-op Vital Signs: stable  Complications: No apparent anesthesia complications

## 2012-01-19 NOTE — Progress Notes (Signed)
Post Partum Day 1 Subjective: up ad lib, voiding, tolerating PO and c/o hemorrhoids  Objective: Blood pressure 106/60, pulse 65, temperature 98.3 F (36.8 C), temperature source Oral, resp. rate 18, height 5\' 2"  (1.575 m), weight 67.132 kg (148 lb), last menstrual period 04/17/2011, SpO2 97.00%, unknown if currently breastfeeding.  Physical Exam:  General: alert and cooperative Lochia: appropriate Uterine Fundus: firm Incision: perineum intact, +hemorrhoids DVT Evaluation: No evidence of DVT seen on physical exam.   Basename 01/19/12 0530 01/18/12 0730  HGB 9.2* 10.4*  HCT 27.5* 31.2*    Assessment/Plan: Plan for discharge tomorrow Proctofoam HC   LOS: 1 day   Juliya Magill G 01/19/2012, 8:33 AM

## 2012-01-20 MED ORDER — OXYCODONE-ACETAMINOPHEN 5-325 MG PO TABS: 1.0000 | ORAL_TABLET | ORAL | Status: AC | PRN

## 2012-01-20 MED ORDER — HYDROCORTISONE ACE-PRAMOXINE 1-1 % RE FOAM: 1.0000 | Freq: Two times a day (BID) | RECTAL | Status: AC

## 2012-01-20 MED ORDER — IBUPROFEN 600 MG PO TABS: 600.0000 mg | ORAL_TABLET | Freq: Four times a day (QID) | ORAL | Status: AC

## 2012-01-20 NOTE — Discharge Summary (Signed)
Obstetric Discharge Summary Reason for Admission: induction of labor Prenatal Procedures: ultrasound Intrapartum Procedures: spontaneous vaginal delivery Postpartum Procedures: none Complications-Operative and Postpartum: 2 degree perineal laceration Hemoglobin  Date Value Range Status  01/19/2012 9.2* 12.0 - 15.0 g/dL Final     HCT  Date Value Range Status  01/19/2012 27.5* 36.0 - 46.0 % Final    Physical Exam:  General: alert and cooperative Lochia: appropriate Uterine Fundus: firm Incision: perineum intact, small hemorrhoids DVT Evaluation: No evidence of DVT seen on physical exam.  Discharge Diagnoses: Term Pregnancy-delivered  Discharge Information: Date: 01/20/2012 Activity: pelvic rest Diet: routine Medications: PNV, Ibuprofen, Percocet and proctofoam hc Condition: stable Instructions: refer to practice specific booklet Discharge to: home   Newborn Data: Live born female  Birth Weight: 7 lb 9.6 oz (3447 g) APGAR: 7, 8  Home with mother.  Garet Hooton G 01/20/2012, 8:46 AM

## 2012-10-16 ENCOUNTER — Other Ambulatory Visit: Payer: Self-pay | Admitting: Obstetrics and Gynecology

## 2012-10-16 DIAGNOSIS — T8543XA Leakage of breast prosthesis and implant, initial encounter: Secondary | ICD-10-CM

## 2012-10-26 ENCOUNTER — Ambulatory Visit
Admission: RE | Admit: 2012-10-26 | Discharge: 2012-10-26 | Disposition: A | Discharge status: Home / Self Care | Payer: Managed Care, Other (non HMO) | Source: Ambulatory Visit | Attending: Obstetrics and Gynecology | Admitting: Obstetrics and Gynecology

## 2012-10-26 DIAGNOSIS — T8543XA Leakage of breast prosthesis and implant, initial encounter: Secondary | ICD-10-CM

## 2013-11-25 ENCOUNTER — Other Ambulatory Visit: Payer: Self-pay

## 2013-11-25 DIAGNOSIS — Z1231 Encounter for screening mammogram for malignant neoplasm of breast: Secondary | ICD-10-CM

## 2013-12-16 ENCOUNTER — Ambulatory Visit
Admission: RE | Admit: 2013-12-16 | Discharge: 2013-12-16 | Disposition: A | Discharge status: Home / Self Care | Payer: Managed Care, Other (non HMO) | Source: Ambulatory Visit

## 2013-12-16 ENCOUNTER — Encounter (INDEPENDENT_AMBULATORY_CARE_PROVIDER_SITE_OTHER): Payer: Self-pay

## 2013-12-16 DIAGNOSIS — Z1231 Encounter for screening mammogram for malignant neoplasm of breast: Secondary | ICD-10-CM

## 2014-04-21 ENCOUNTER — Encounter (HOSPITAL_COMMUNITY): Payer: Self-pay

## 2016-02-22 ENCOUNTER — Emergency Department (HOSPITAL_BASED_OUTPATIENT_CLINIC_OR_DEPARTMENT_OTHER)
Admission: EM | Admit: 2016-02-22 | Discharge: 2016-02-22 | Disposition: A | Discharge status: Home / Self Care | Payer: Managed Care, Other (non HMO) | Attending: Emergency Medicine | Admitting: Emergency Medicine

## 2016-02-22 ENCOUNTER — Encounter (HOSPITAL_BASED_OUTPATIENT_CLINIC_OR_DEPARTMENT_OTHER): Payer: Self-pay | Admitting: *Deleted

## 2016-02-22 DIAGNOSIS — R112 Nausea with vomiting, unspecified: Secondary | ICD-10-CM | POA: Insufficient documentation

## 2016-02-22 DIAGNOSIS — R51 Headache: Secondary | ICD-10-CM | POA: Insufficient documentation

## 2016-02-22 DIAGNOSIS — H539 Unspecified visual disturbance: Secondary | ICD-10-CM | POA: Insufficient documentation

## 2016-02-22 DIAGNOSIS — Z79899 Other long term (current) drug therapy: Secondary | ICD-10-CM | POA: Diagnosis not present

## 2016-02-22 DIAGNOSIS — R519 Headache, unspecified: Secondary | ICD-10-CM

## 2016-02-22 MED ORDER — SODIUM CHLORIDE 0.9 % IV BOLUS (SEPSIS)
1000.0000 mL | Freq: Once | INTRAVENOUS | Status: AC
  Administered 2016-02-22: 1000 mL via INTRAVENOUS

## 2016-02-22 MED ORDER — DIPHENHYDRAMINE HCL 50 MG/ML IJ SOLN
25.0000 mg | Freq: Once | INTRAMUSCULAR | Status: AC
  Administered 2016-02-22: 25 mg via INTRAVENOUS
  Filled 2016-02-22: qty 1

## 2016-02-22 MED ORDER — KETOROLAC TROMETHAMINE 15 MG/ML IJ SOLN
15.0000 mg | Freq: Once | INTRAMUSCULAR | Status: AC
  Administered 2016-02-22: 15 mg via INTRAVENOUS
  Filled 2016-02-22: qty 1

## 2016-02-22 MED ORDER — DEXAMETHASONE SODIUM PHOSPHATE 10 MG/ML IJ SOLN
10.0000 mg | Freq: Once | INTRAMUSCULAR | Status: AC
  Administered 2016-02-22: 10 mg via INTRAVENOUS
  Filled 2016-02-22: qty 1

## 2016-02-22 MED ORDER — METOCLOPRAMIDE HCL 5 MG/ML IJ SOLN
10.0000 mg | Freq: Once | INTRAMUSCULAR | Status: AC
  Administered 2016-02-22: 10 mg via INTRAVENOUS
  Filled 2016-02-22: qty 2

## 2016-02-22 NOTE — ED Provider Notes (Signed)
[facility]   CSN: [id number] Arrival date & time: 02/22/16  1741  By signing my name below, I, Linda Fitzpatrick, attest that this documentation has been prepared under the direction and in the presence of Cheri Fowler, PA-C Electronically Signed: Soijett Fitzpatrick, ED Scribe. 02/22/16. 8:00 PM.   History   Chief Complaint Chief Complaint  Patient presents with  . Migraine    HPI Linda Fitzpatrick is a 50 y.o. female who presents to the Emergency Department complaining of migraine onset 11 PM last night. She denies recent head injury or falling. She notes that this HA is similar to HA that she has had in the past. She states that when she gets HA, Imitrex, sleep, and eating well, works best to alleviate her HA. Pt notes that she has been evaluated by neurologist in the past for her symptoms and informed that it may be environmental factors causing her migraines. Pt is having associated symptoms of nausea, vomiting, photophobia, sensitivity to sound, and seeing spots. She notes that she has not tried any medications for the relief of her symptoms. She denies facial droop, weakness, slurred speech, fever, neck stiffness, and any other symptoms.    The history is provided by the patient. No language interpreter was used.    Past Medical History:  Diagnosis Date  . Abnormal Pap smear   . AMA (advanced maternal age) multigravida 35+   . Anxiety   . Depression   . H/O varicella   . HPV (human papilloma virus) anogenital infection   . HSV-1 infection   . HSV-2 infection   . Hyperemesis   . Pregnancy induced hypertension     There are no active problems to display for this patient.   Past Surgical History:  Procedure Laterality Date  . BREAST ENHANCEMENT SURGERY  2001  . BREAST SURGERY     augmentation  . CERVICAL BIOPSY  2012   abnormal pap  . COLPOSCOPY    . GYNECOLOGIC CRYOSURGERY    . WISDOM TOOTH EXTRACTION  2003    OB History    Gravida Para Term Preterm  AB Living   3 2 2  0 1 2   SAB TAB Ectopic Multiple Live Births   0 1 0 0 2       Home Medications    Prior to Admission medications   Medication Sig Start Date End Date Taking? Authorizing Provider  acetaminophen (TYLENOL) 325 MG tablet Take 650 mg by mouth every 6 (six) hours as needed. Head ache    Historical Provider, MD  nystatin cream (MYCOSTATIN) Apply topically 2 (two) times daily.    Historical Provider, MD  Pediatric Multivit-Minerals-C (FLINTSTONES GUMMIES PO) Take 2 tablets by mouth daily.      Historical Provider, MD    Family History Family History  Problem Relation Age of Onset  . Anesthesia problems Neg Hx   . Hypertension Mother   . Thyroid disease Mother   . Cancer Father     lung  . Migraines Father   . Migraines Sister   . Cancer Maternal Grandfather     colon  . Cancer Paternal Grandmother     breast    Social History Social History  Substance Use Topics  . Smoking status: Never Smoker  . Smokeless tobacco: Never Used  . Alcohol use No     Allergies   Tramadol   Review of Systems Review of Systems  Eyes: Positive for photophobia and visual disturbance (seeing  spots).  Gastrointestinal: Positive for nausea and vomiting.  Neurological: Positive for headaches. Negative for speech difficulty and weakness.       No facial droop     Physical Exam Updated Vital Signs BP 107/64 (BP Location: Right Arm)   Pulse 68   Temp 98.1 F (36.7 C) (Oral)   Resp 18   Ht 5\' 2"  (1.575 m)   Wt 59 kg   LMP 04/17/2011   SpO2 100%   BMI 23.78 kg/m   Physical Exam  Constitutional: She is oriented to person, place, and time. She appears well-developed and well-nourished.  Non-toxic appearance. She does not have a sickly appearance. She does not appear ill.  HENT:  Head: Normocephalic and atraumatic.  Mouth/Throat: Oropharynx is clear and moist.  Eyes: Conjunctivae are normal. Pupils are equal, round, and reactive to light.  Neck: Normal range of  motion. Neck supple.  No meningismus.   Cardiovascular: Normal rate and regular rhythm.   Pulmonary/Chest: Effort normal and breath sounds normal. No accessory muscle usage or stridor. No respiratory distress. She has no wheezes. She has no rhonchi. She has no rales.  Abdominal: Soft. Bowel sounds are normal. She exhibits no distension. There is no tenderness.  Musculoskeletal: Normal range of motion.  Lymphadenopathy:    She has no cervical adenopathy.  Neurological: She is alert and oriented to person, place, and time.  Mental Status:   AOx3.  Speech clear without dysarthria. Cranial Nerves:  I-not tested  II-PERRLA  III, IV, VI-EOMs intact  V-temporal and masseter strength intact  VII-symmetrical facial movements intact, no facial droop  VIII-hearing grossly intact bilaterally  IX, X-gag intact  XI-strength of sternomastoid and trapezius muscles 5/5  XII-tongue midline Motor:   Good muscle bulk and tone  Strength 5/5 bilaterally in upper and lower extremities   Cerebellar--intact RAMs, finger to nose intact bilaterally.  Gait normal  No pronator drift Sensory:  Intact in upper and lower extremities   Skin: Skin is warm and dry.  Psychiatric: She has a normal mood and affect. Her behavior is normal.     ED Treatments / Results  DIAGNOSTIC STUDIES: Oxygen Saturation is 100% on RA, nl by my interpretation.    COORDINATION OF CARE: 7:51 PM Discussed treatment plan with pt at bedside which includes decadron, reglan, benadryl, toradol, IV fluids, and pt agreed to plan.  Procedures Procedures (including critical care time)  Medications Ordered in ED Medications  sodium chloride 0.9 % bolus 1,000 mL (0 mLs Intravenous Stopped 02/22/16 2054)  metoCLOPramide (REGLAN) injection 10 mg (10 mg Intravenous Given 02/22/16 2016)  diphenhydrAMINE (BENADRYL) injection 25 mg (25 mg Intravenous Given 02/22/16 2008)  ketorolac (TORADOL) 15 MG/ML injection 15 mg (15 mg Intravenous Given 02/22/16  2010)  dexamethasone (DECADRON) injection 10 mg (10 mg Intravenous Given 02/22/16 2013)     Initial Impression / Assessment and Plan / ED Course  I have reviewed the triage vital signs and the nursing notes.   Clinical Course   Typical migraine headache for the pt. Non focal neuro exam. No recent head trauma. No fever. Doubt meningitis. Doubt intracranial bleed. Doubt normal pressure hydrocephalus. No indication for imaging. Will treat with migraine cocktail and reevaluate. Patient improved.  Home with neuro follow up.  Return precautions discussed.  Stable for discharge.    Final Clinical Impressions(s) / ED Diagnoses   Final diagnoses:  Bad headache    New Prescriptions New Prescriptions   No medications on file   I personally  performed the services described in this documentation, which was scribed in my presence. The recorded information has been reviewed and is accurate.     Cheri Fowler, PA-C 02/22/16 2120    Loren Racer, MD 02/22/16 [phone]

## 2016-02-22 NOTE — ED Triage Notes (Signed)
Pt reports migraine since last night.  Hx of same.  States that it feels like a normal migraine.

## 2016-11-02 ENCOUNTER — Emergency Department (HOSPITAL_BASED_OUTPATIENT_CLINIC_OR_DEPARTMENT_OTHER): Payer: Commercial Managed Care - PPO

## 2016-11-02 ENCOUNTER — Emergency Department (HOSPITAL_BASED_OUTPATIENT_CLINIC_OR_DEPARTMENT_OTHER)
Admission: EM | Admit: 2016-11-02 | Discharge: 2016-11-02 | Disposition: A | Discharge status: Home / Self Care | Payer: Commercial Managed Care - PPO | Attending: Physician Assistant | Admitting: Physician Assistant

## 2016-11-02 ENCOUNTER — Encounter (HOSPITAL_BASED_OUTPATIENT_CLINIC_OR_DEPARTMENT_OTHER): Payer: Self-pay | Admitting: *Deleted

## 2016-11-02 DIAGNOSIS — Y999 Unspecified external cause status: Secondary | ICD-10-CM | POA: Insufficient documentation

## 2016-11-02 DIAGNOSIS — M542 Cervicalgia: Secondary | ICD-10-CM | POA: Diagnosis present

## 2016-11-02 DIAGNOSIS — Y939 Activity, unspecified: Secondary | ICD-10-CM | POA: Insufficient documentation

## 2016-11-02 DIAGNOSIS — X501XXA Overexertion from prolonged static or awkward postures, initial encounter: Secondary | ICD-10-CM | POA: Diagnosis not present

## 2016-11-02 DIAGNOSIS — S161XXA Strain of muscle, fascia and tendon at neck level, initial encounter: Secondary | ICD-10-CM | POA: Diagnosis not present

## 2016-11-02 DIAGNOSIS — Y929 Unspecified place or not applicable: Secondary | ICD-10-CM | POA: Insufficient documentation

## 2016-11-02 MED ORDER — LIDOCAINE 5 % EX PTCH
1.0000 | MEDICATED_PATCH | CUTANEOUS | Status: DC
Start: 2016-11-02 — End: 2016-11-02
  Filled 2016-11-02: qty 1

## 2016-11-02 MED ORDER — DIAZEPAM 2 MG PO TABS: 2.0000 mg | ORAL_TABLET | Freq: Three times a day (TID) | ORAL | 0 refills | Status: AC | PRN

## 2016-11-02 MED ORDER — IBUPROFEN 800 MG PO TABS: 800.0000 mg | ORAL_TABLET | Freq: Three times a day (TID) | ORAL | 0 refills | Status: AC

## 2016-11-02 MED FILL — IBUPROFEN 800 MG TABLET: 800 | 7 days supply | Qty: 21 | Fill #0

## 2016-11-02 MED FILL — diazePAM 2 MG TABS: 2 | 4 days supply | Qty: 12 | Fill #0

## 2016-11-02 NOTE — ED Provider Notes (Signed)
[facility]   CSN: [id number] Arrival date & time: 11/02/16  1511     History   Chief Complaint Chief Complaint  Patient presents with  . Neck Pain    HPI Linda Fitzpatrick is a 51 y.o. female.  HPI   Patient is a 51 year old female with past medical history significant for anxiety depression presenting today with right neck spasm. She reports that the last 2-3 week she's been having spasming in her right neck. It comes and goes. It started when she was reaching over to set her alarm clock. No trauma. Patient has been to the urgent care and given Percocet. She's been taking Flexeril already which she has at home. She reports trying ibuprofen and Tylenol. She reports getting both a therapeutic massage and going to chiropractor twice. She has appointment with her orthopedist next week.  Past Medical History:  Diagnosis Date  . Abnormal Pap smear   . AMA (advanced maternal age) multigravida 35+   . Anxiety   . Depression   . H/O varicella   . HPV (human papilloma virus) anogenital infection   . HSV-1 infection   . HSV-2 infection   . Hyperemesis   . Pregnancy induced hypertension     There are no active problems to display for this patient.   Past Surgical History:  Procedure Laterality Date  . BREAST ENHANCEMENT SURGERY  2001  . BREAST SURGERY     augmentation  . CERVICAL BIOPSY  2012   abnormal pap  . COLPOSCOPY    . GYNECOLOGIC CRYOSURGERY    . WISDOM TOOTH EXTRACTION  2003    OB History    Gravida Para Term Preterm AB Living   3 2 2  0 1 2   SAB TAB Ectopic Multiple Live Births   0 1 0 0 2       Home Medications    Prior to Admission medications   Medication Sig Start Date End Date Taking? Authorizing Provider  acetaminophen (TYLENOL) 325 MG tablet Take 650 mg by mouth every 6 (six) hours as needed. Head ache    [provider]  nystatin cream (MYCOSTATIN) Apply topically 2 (two) times daily.    [provider]   Pediatric Multivit-Minerals-C (FLINTSTONES GUMMIES PO) Take 2 tablets by mouth daily.      [provider]    Family History Family History  Problem Relation Age of Onset  . Anesthesia problems Neg Hx   . Hypertension Mother   . Thyroid disease Mother   . Cancer Father        lung  . Migraines Father   . Migraines Sister   . Cancer Maternal Grandfather        colon  . Cancer Paternal Grandmother        breast    Social History Social History  Substance Use Topics  . Smoking status: Never Smoker  . Smokeless tobacco: Never Used  . Alcohol use No     Allergies   Tramadol   Review of Systems Review of Systems  Constitutional: Negative for fatigue and fever.  Musculoskeletal: Positive for neck pain.     Physical Exam Updated Vital Signs BP (!) 129/92 (BP Location: Right Arm)   Pulse 93   Temp 98.5 F (36.9 C) (Oral)   Resp 16   Ht 5\' 2"  (1.575 m)   Wt 120 lb (54.4 kg)   LMP 04/17/2011   SpO2 100%   BMI 21.95 kg/m  Physical Exam  Constitutional: She is oriented to person, place, and time. She appears well-developed and well-nourished.  HENT:  Head: Normocephalic and atraumatic.  Eyes: Right eye exhibits no discharge.  Neck: Normal range of motion. Neck supple.  Normal neck  Cardiovascular: Normal rate.   Pulmonary/Chest: Effort normal and breath sounds normal.  Abdominal: Soft. She exhibits no distension. There is no tenderness.  Neurological: She is oriented to person, place, and time.  R arm normal strength and sensation.   Skin: Skin is warm and dry. She is not diaphoretic.  Psychiatric: She has a normal mood and affect.  Nursing note and vitals reviewed.    ED Treatments / Results  Labs (all labs ordered are listed, but only abnormal results are displayed) Labs Reviewed - No data to display  EKG  EKG Interpretation None       Radiology No results found.  Procedures Procedures (including critical care  time)  Medications Ordered in ED Medications  lidocaine (LIDODERM) 5 % 1 patch (not administered)     Initial Impression / Assessment and Plan / ED Course  I have reviewed the triage vital signs and the nursing notes.  Pertinent labs & imaging results that were available during my care of the patient were reviewed by me and considered in my medical decision making (see chart for details).    Patient is a 51 year old female presenting with neck pain and swelling of the last several weeks. It comes and goes. Sounds like spasms. Patient refers it to as spasms. Patient is to multiple providers. We will do plain films show there is nothing that needs to be addressed. Otherwise we'll have patient follow up with her orthopedic. We'll try a lidocaine patch and give her Valium to help with muscle spasm. I do not believe this represents a dangerous pathology. Patient has no weakness or sensation changes. Patient has no fevers therefore do not suspect infection.  Final Clinical Impressions(s) / ED Diagnoses   Final diagnoses:  None    New Prescriptions New Prescriptions   No medications on file     Abelino Derrick, MD 11/02/16 [phone]

## 2016-11-02 NOTE — Discharge Instructions (Signed)
We recommend that you use Valium as a muscle relaxant. You may also buy over-the-counter lidocaine patches. They can be helpful in targeting local pain.

## 2016-11-02 NOTE — ED Triage Notes (Signed)
Pt reports neck pain x 1 week.  States that she went to urgent care and was given hydrocodone and steroids. Denies known injury. Pt also reports taking flexeril.

## 2016-11-24 ENCOUNTER — Other Ambulatory Visit: Payer: Self-pay | Admitting: Orthopedic Surgery

## 2016-11-24 DIAGNOSIS — M5410 Radiculopathy, site unspecified: Secondary | ICD-10-CM

## 2016-11-26 ENCOUNTER — Ambulatory Visit
Admission: RE | Admit: 2016-11-26 | Discharge: 2016-11-26 | Disposition: A | Discharge status: Home / Self Care | Payer: Commercial Managed Care - PPO | Source: Ambulatory Visit | Attending: Orthopedic Surgery | Admitting: Orthopedic Surgery

## 2016-11-26 DIAGNOSIS — M5410 Radiculopathy, site unspecified: Secondary | ICD-10-CM

## 2017-06-17 IMAGING — MR MR CERVICAL SPINE W/O CM
4 of 5 series · 22 of 48 positions shown · non-contrast
Comparison: Plain films cervical spine 11/01/2016.

CLINICAL DATA: Pain in the right side of the neck and trapezius
which began suddenly 4 weeks ago when the patient pulled herself out
of bed and heard a crack in her neck.

EXAM:
MRI CERVICAL SPINE WITHOUT CONTRAST
TECHNIQUE: Multiplanar, multisequence MR imaging of the cervical spine was
performed. No intravenous contrast was administered.

[Series 2: T2 · sagittal · 3.0mm · 0.39mm/px · 6 of 13 slices shown (1 of 3)]
[im 1/13]
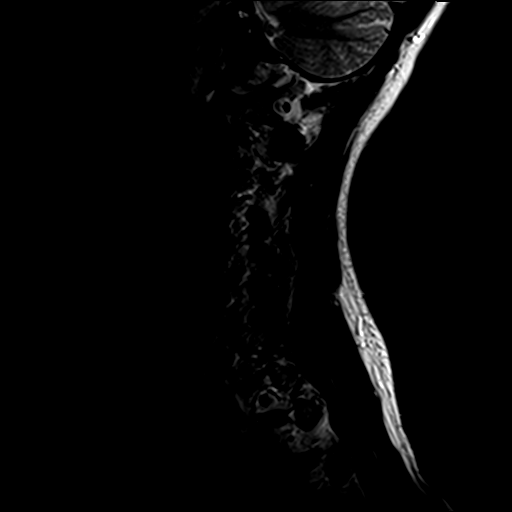
[im 3/13]
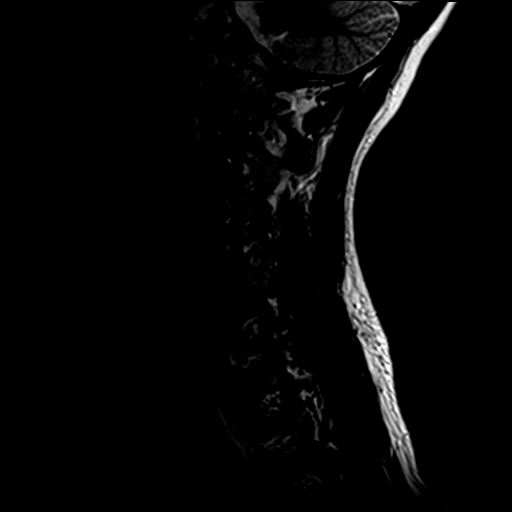
[im 5/13]
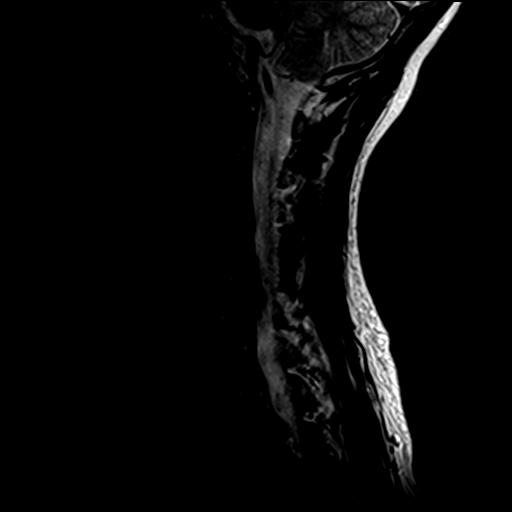
[im 8/13]
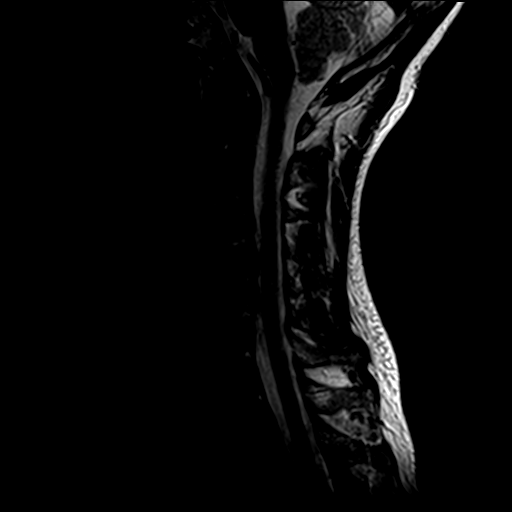
[im 10/13]
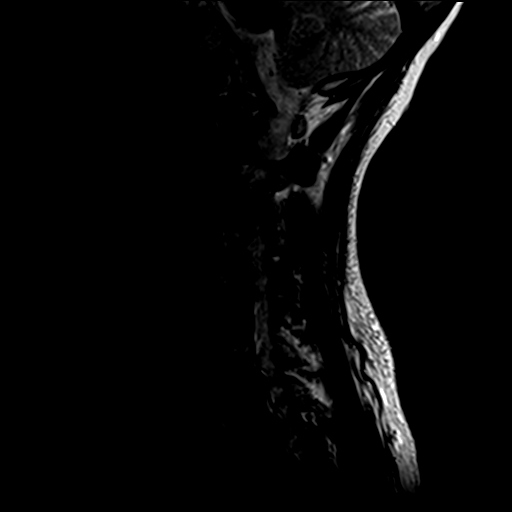
[im 13/13]
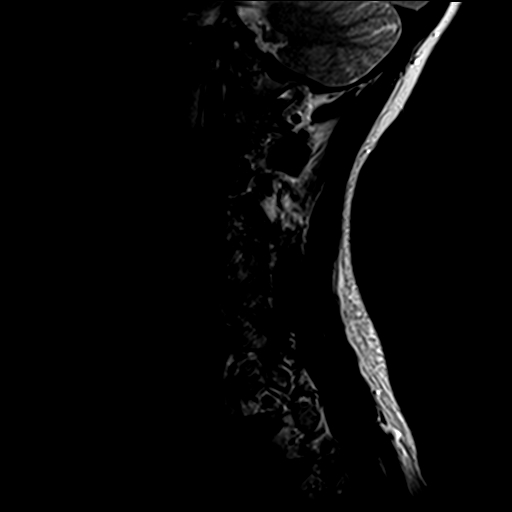

[Series 3: T1 · sagittal · 3.0mm · 0.41mm/px · 3 of 13 slices shown]
[im 3/13]
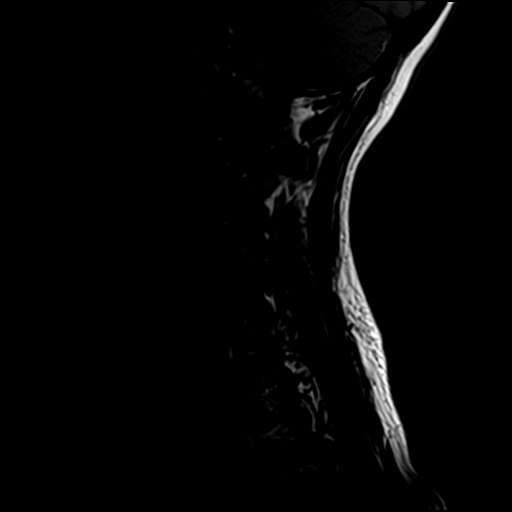
[im 7/13]
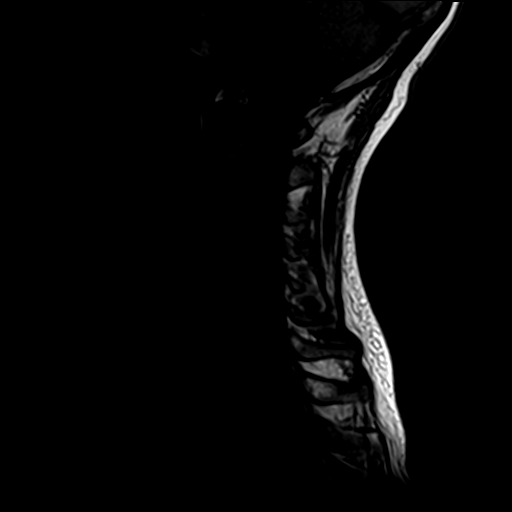
[im 11/13]
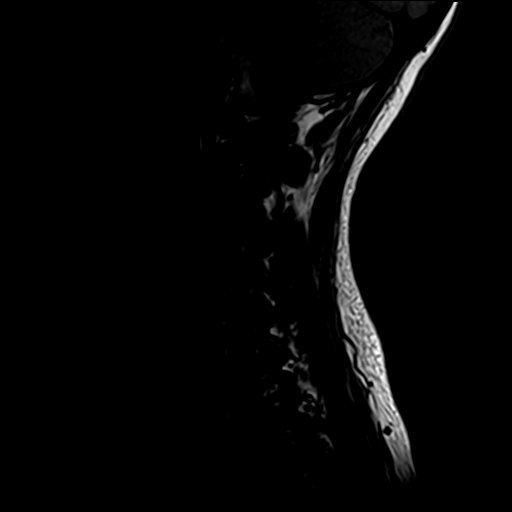

[Series 5: T2 · axial · 3.0mm · 0.39mm/px · z∈[-2,+80]mm · 8 of 26 slices shown (2 of 3)]
[im 1/26]
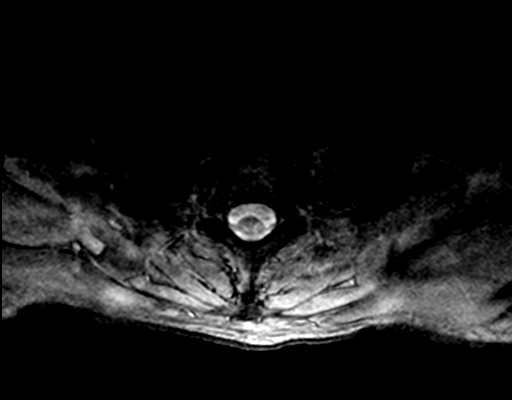
[im 4/26]
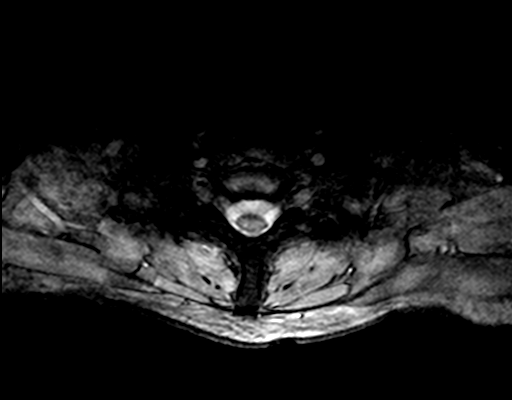
[im 8/26]
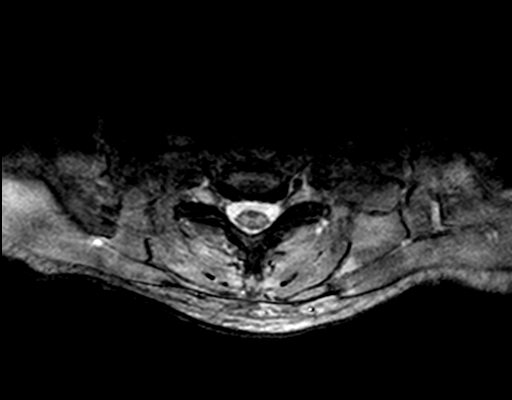
[im 12/26]
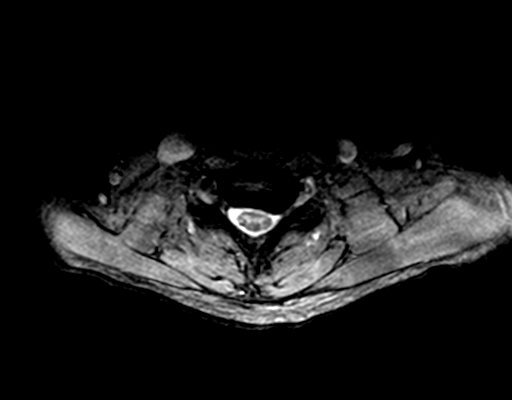
[im 14/26]
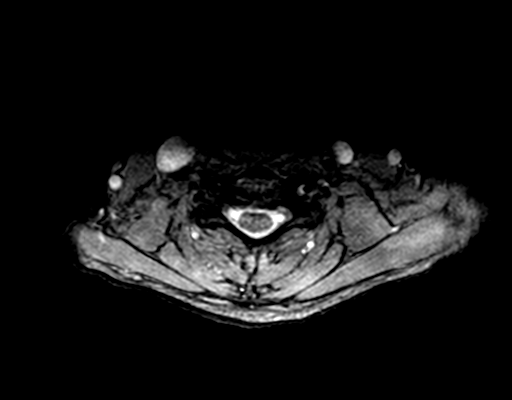
[im 18/26]
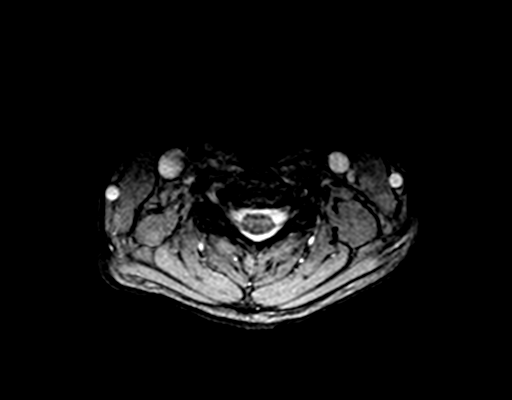
[im 22/26]
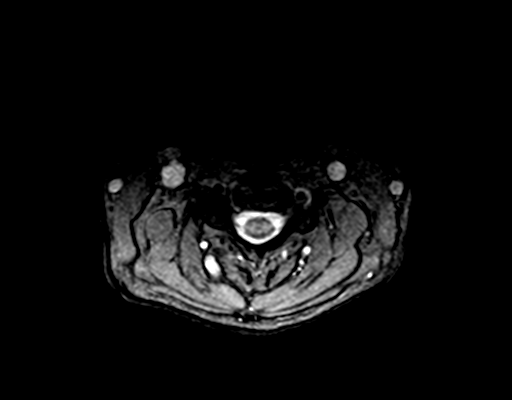
[im 26/26]
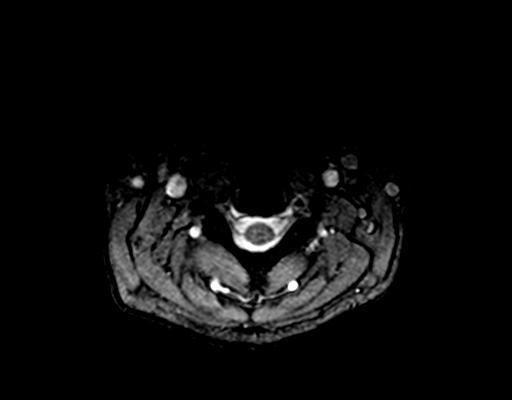

[Series 6: T2 · axial · 3.0mm · 0.39mm/px · z∈[-3,+66]mm · 5 of 26 slices shown (3 of 3)]
[im 1/26]
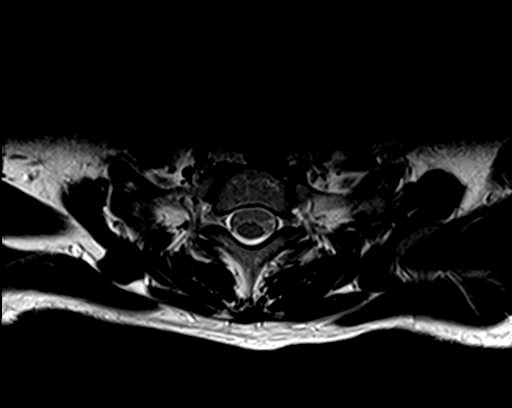
[im 4/26]
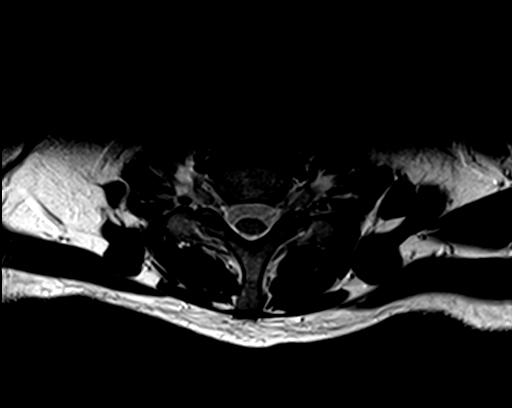
[im 8/26]
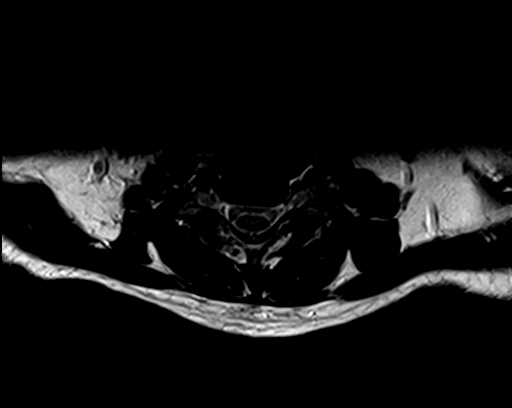
[im 14/26]
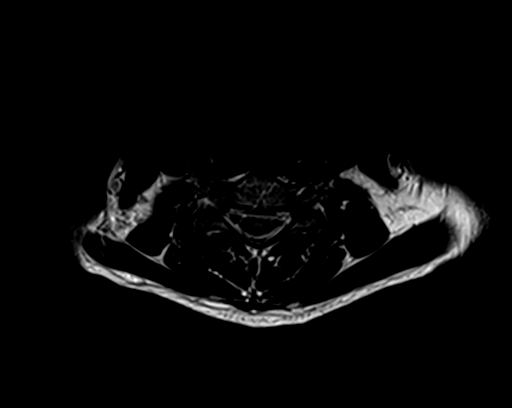
[im 22/26]
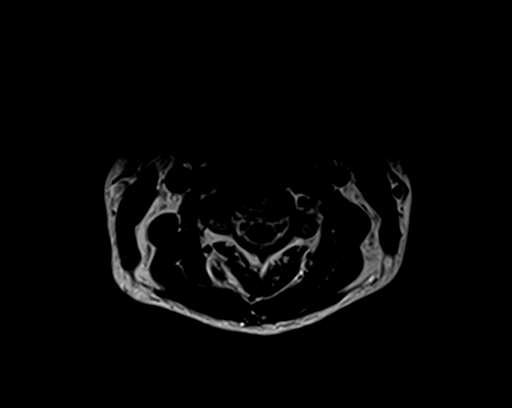

[22 of 48 positions shown; findings below may reference images not displayed]

FINDINGS: Alignment: There is straightening of the normal cervical lordosis.
No listhesis.

Vertebrae: No fracture or worrisome lesion.

Cord: Normal signal throughout.

Posterior Fossa, vertebral arteries, paraspinal tissues: Negative.

Disc levels:

C2-3:  Negative.

C3-4: Uncovertebral spurring on the right and mild to moderate right
facet degenerative disease cause moderate right foraminal narrowing.
The central canal and left foramen are open.

C4-5: Minimal disc bulge without central canal or foraminal
stenosis.

C5-6: The patient has a shallow disc bulge and left much worse than
right uncovertebral disease. Severe left foraminal narrowing is
present. The right foramen is open. Disc effaces the ventral thecal
sac.

C6-7:  Negative.

C7-T1:  Negative.
IMPRESSION: Spondylosis worst at C5-6 where uncovertebral disease causes severe
left foraminal narrowing and a disc bulge effaces the ventral thecal
sac.

Moderate right foraminal narrowing C3-4 due to facet and
uncovertebral degenerative change.

## 2018-07-13 ENCOUNTER — Encounter (HOSPITAL_BASED_OUTPATIENT_CLINIC_OR_DEPARTMENT_OTHER): Payer: Self-pay | Admitting: *Deleted

## 2018-07-13 ENCOUNTER — Emergency Department (HOSPITAL_BASED_OUTPATIENT_CLINIC_OR_DEPARTMENT_OTHER)
Admission: EM | Admit: 2018-07-13 | Discharge: 2018-07-13 | Disposition: A | Discharge status: Home / Self Care | Payer: BLUE CROSS/BLUE SHIELD | Attending: Emergency Medicine | Admitting: Emergency Medicine

## 2018-07-13 ENCOUNTER — Other Ambulatory Visit: Payer: Self-pay

## 2018-07-13 DIAGNOSIS — Z79899 Other long term (current) drug therapy: Secondary | ICD-10-CM | POA: Insufficient documentation

## 2018-07-13 DIAGNOSIS — R519 Headache, unspecified: Secondary | ICD-10-CM

## 2018-07-13 DIAGNOSIS — R51 Headache: Secondary | ICD-10-CM | POA: Insufficient documentation

## 2018-07-13 HISTORY — DX: Migraine, unspecified, not intractable, without status migrainosus: G43.909

## 2018-07-13 MED ORDER — DIPHENHYDRAMINE HCL 50 MG/ML IJ SOLN
25.0000 mg | Freq: Once | INTRAMUSCULAR | Status: AC
  Administered 2018-07-13: 25 mg via INTRAVENOUS
  Filled 2018-07-13: qty 1

## 2018-07-13 MED ORDER — PROCHLORPERAZINE EDISYLATE 10 MG/2ML IJ SOLN
10.0000 mg | Freq: Once | INTRAMUSCULAR | Status: AC
  Administered 2018-07-13: 10 mg via INTRAVENOUS
  Filled 2018-07-13: qty 2

## 2018-07-13 MED ORDER — KETOROLAC TROMETHAMINE 15 MG/ML IJ SOLN
15.0000 mg | Freq: Once | INTRAMUSCULAR | Status: AC
  Administered 2018-07-13: 15 mg via INTRAVENOUS
  Filled 2018-07-13: qty 1

## 2018-07-13 MED ORDER — DEXAMETHASONE 4 MG PO TABS
10.0000 mg | ORAL_TABLET | Freq: Once | ORAL | Status: AC
  Administered 2018-07-13: 10 mg via ORAL
  Filled 2018-07-13: qty 1

## 2018-07-13 NOTE — ED Notes (Signed)
C/o migraine off and on since December radiating into ncek  Worse last 3 days w nausea

## 2018-07-13 NOTE — ED Triage Notes (Signed)
Reports migraine for 2 weeks with history of the same.

## 2018-07-13 NOTE — Discharge Instructions (Signed)
Please call your neurologist and let them know that you were seen in the emergency department.  See if they want to change any of your medications or may be seen sooner in the office.  Please return for worsening headache inability to eat or drink fever or neck stiffness.  Also return for unilateral numbness or weakness difficulty with speech or swallowing.

## 2018-07-13 NOTE — ED Provider Notes (Signed)
[facility] EMERGENCY DEPARTMENT Provider Note   CSN: [id number] Arrival date & time: 07/13/18  1020     History   Chief Complaint Chief Complaint  Patient presents with  . Migraine    HPI Linda Fitzpatrick is a 53 y.o. female.  53 yo F with a chief complaint of a headache.  This is similar to her prior headaches.  Is been going on since about Christmas time which is about a month ago.  She states that she has had headaches similar to this but usually do not last this long.  She denies unilateral numbness or tingling.  She denies fevers or chills denies cough or congestion.  Complains of a tenseness to her neck that goes up to the back of her head and wraps around to the front.  She is had some nausea with this.  She is seen her neurologist in the recent past and had steroid injections to her neck with mild improvement.  She went to go see them again today but the neurologist unfortunately was ill and so she came here.  The history is provided by the patient.  Migraine  This is a new problem. The current episode started more than 1 week ago. The problem occurs constantly. The problem has been gradually worsening. Pertinent negatives include no chest pain, no headaches and no shortness of breath. Nothing aggravates the symptoms. Nothing relieves the symptoms. She has tried nothing for the symptoms. The treatment provided no relief.  Illness  Associated symptoms: no chest pain, no congestion, no fever, no headaches, no myalgias, no nausea, no rhinorrhea, no shortness of breath, no vomiting and no wheezing     Past Medical History:  Diagnosis Date  . Abnormal Pap smear   . AMA (advanced maternal age) multigravida 35+   . Anxiety   . Depression   . H/O varicella   . HPV (human papilloma virus) anogenital infection   . HSV-1 infection   . HSV-2 infection   . Hyperemesis   . Migraine   . Pregnancy induced hypertension     There are no active problems to display for this  patient.   Past Surgical History:  Procedure Laterality Date  . BREAST ENHANCEMENT SURGERY  2001  . BREAST SURGERY     augmentation  . CERVICAL BIOPSY  2012   abnormal pap  . COLPOSCOPY    . GYNECOLOGIC CRYOSURGERY    . WISDOM TOOTH EXTRACTION  2003     OB History    Gravida  3   Para  2   Term  2   Preterm  0   AB  1   Living  2     SAB  0   TAB  1   Ectopic  0   Multiple  0   Live Births  2            Home Medications    Prior to Admission medications   Medication Sig Start Date End Date Taking? Authorizing Provider  buPROPion (WELLBUTRIN) 100 MG tablet Take 300 mg by mouth 2 (two) times daily.   Yes [provider]  sertraline (ZOLOFT) 100 MG tablet Take 150 mg by mouth daily.   Yes [provider]  acetaminophen (TYLENOL) 325 MG tablet Take 650 mg by mouth every 6 (six) hours as needed. Head ache    [provider]  diazepam (VALIUM) 2 MG tablet Take 1 tablet (2 mg total) by mouth every 8 (eight)  hours as needed for muscle spasms. 11/02/16   Mackuen, Courteney Lyn, MD  ibuprofen (ADVIL,MOTRIN) 800 MG tablet Take 1 tablet (800 mg total) by mouth 3 (three) times daily. 11/02/16   Mackuen, Courteney Lyn, MD  nystatin cream (MYCOSTATIN) Apply topically 2 (two) times daily.    [provider]  Pediatric Multivit-Minerals-C (FLINTSTONES GUMMIES PO) Take 2 tablets by mouth daily.      [provider]    Family History Family History  Problem Relation Age of Onset  . Cancer Maternal Grandfather        colon  . Hypertension Mother   . Thyroid disease Mother   . Cancer Father        lung  . Migraines Father   . Migraines Sister   . Cancer Paternal Grandmother        breast  . Anesthesia problems Neg Hx     Social History Social History   Tobacco Use  . Smoking status: Never Smoker  . Smokeless tobacco: Never Used  Substance Use Topics  . Alcohol use: No  . Drug use: No     Allergies    Tramadol   Review of Systems Review of Systems  Constitutional: Negative for chills and fever.  HENT: Negative for congestion and rhinorrhea.   Eyes: Negative for redness and visual disturbance.  Respiratory: Negative for shortness of breath and wheezing.   Cardiovascular: Negative for chest pain and palpitations.  Gastrointestinal: Negative for nausea and vomiting.  Genitourinary: Negative for dysuria and urgency.  Musculoskeletal: Negative for arthralgias and myalgias.  Skin: Negative for pallor and wound.  Neurological: Negative for dizziness and headaches.     Physical Exam Updated Vital Signs BP (!) 134/94 (BP Location: Right Arm)   Pulse 96   Temp 98.3 F (36.8 C) (Oral)   Resp 18   Ht 5\' 2"  (1.575 m)   Wt 61.4 kg   LMP 04/17/2011   SpO2 99%   BMI 24.76 kg/m   Physical Exam Vitals signs and nursing note reviewed.  Constitutional:      General: She is not in acute distress.    Appearance: She is well-developed. She is not diaphoretic.  HENT:     Head: Normocephalic and atraumatic.  Eyes:     Pupils: Pupils are equal, round, and reactive to light.  Neck:     Musculoskeletal: Normal range of motion and neck supple.  Cardiovascular:     Rate and Rhythm: Normal rate and regular rhythm.     Heart sounds: No murmur. No friction rub. No gallop.   Pulmonary:     Effort: Pulmonary effort is normal.     Breath sounds: No wheezing or rales.  Abdominal:     General: There is no distension.     Palpations: Abdomen is soft.     Tenderness: There is no abdominal tenderness.  Musculoskeletal:        General: No tenderness.  Skin:    General: Skin is warm and dry.  Neurological:     Mental Status: She is alert and oriented to person, place, and time.     Cranial Nerves: Cranial nerves are intact.     Sensory: Sensation is intact.     Motor: Motor function is intact.     Coordination: Coordination is intact.     Gait: Gait is intact.     Comments: Benign  neurologic exam  Psychiatric:        Behavior: Behavior normal.  ED Treatments / Results  Labs (all labs ordered are listed, but only abnormal results are displayed) Labs Reviewed - No data to display  EKG None  Radiology No results found.  Procedures Procedures (including critical care time)  Medications Ordered in ED Medications  ketorolac (TORADOL) 15 MG/ML injection 15 mg (15 mg Intravenous Given 07/13/18 1115)  prochlorperazine (COMPAZINE) injection 10 mg (10 mg Intravenous Given 07/13/18 1112)  diphenhydrAMINE (BENADRYL) injection 25 mg (25 mg Intravenous Given 07/13/18 1107)  dexamethasone (DECADRON) tablet 10 mg (10 mg Oral Given 07/13/18 1121)     Initial Impression / Assessment and Plan / ED Course  I have reviewed the triage vital signs and the nursing notes.  Pertinent labs & imaging results that were available during my care of the patient were reviewed by me and considered in my medical decision making (see chart for details).     53 yo F with a chief complaint of headache.  Feels like her prior.  Has seen a neurologist even recently for the same headache.  Neurologic exam for me is benign.  Will treat with a headache cocktail.  Because of the musculoskeletal nature in her neck will add Toradol.  Patient feeling significantly better on reassessment.  Requesting discharge home.  12:21 PM:  I have discussed the diagnosis/risks/treatment options with the patient and family and believe the pt to be eligible for discharge home to follow-up with PCP. We also discussed returning to the ED immediately if new or worsening sx occur. We discussed the sx which are most concerning (e.g., sudden worsening pain, fever, inability to tolerate by mouth) that necessitate immediate return. Medications administered to the patient during their visit and any new prescriptions provided to the patient are listed below.  Medications given during this visit Medications  ketorolac  (TORADOL) 15 MG/ML injection 15 mg (15 mg Intravenous Given 07/13/18 1115)  prochlorperazine (COMPAZINE) injection 10 mg (10 mg Intravenous Given 07/13/18 1112)  diphenhydrAMINE (BENADRYL) injection 25 mg (25 mg Intravenous Given 07/13/18 1107)  dexamethasone (DECADRON) tablet 10 mg (10 mg Oral Given 07/13/18 1121)     The patient appears reasonably screen and/or stabilized for discharge and I doubt any other medical condition or other [facility] requiring further screening, evaluation, or treatment in the ED at this time prior to discharge.    Final Clinical Impressions(s) / ED Diagnoses   Final diagnoses:  Occipital headache    ED Discharge Orders    None       Melene PlanFloyd, Hadrian Yarbrough, DO 07/13/18 1221

## 2018-07-13 NOTE — ED Notes (Signed)
ED Provider at bedside.
